# Patient Record
Sex: Female | Born: 1960 | Race: White | Hispanic: No | Marital: Married | Smoking: Never smoker
Health system: Southern US, Community
[De-identification: ages and names within clinical notes are randomized; demographics above are authoritative.]

## PROBLEM LIST (undated history)

## (undated) DIAGNOSIS — Z800 Family history of malignant neoplasm of digestive organs: Secondary | ICD-10-CM

## (undated) DIAGNOSIS — N390 Urinary tract infection, site not specified: Secondary | ICD-10-CM

## (undated) DIAGNOSIS — C439 Malignant melanoma of skin, unspecified: Secondary | ICD-10-CM

## (undated) HISTORY — DX: Malignant melanoma of skin, unspecified: C43.9

## (undated) HISTORY — PX: MELANOMA EXCISION: SHX5266

## (undated) HISTORY — DX: Family history of malignant neoplasm of digestive organs: Z80.0

## (undated) HISTORY — DX: Urinary tract infection, site not specified: N39.0

---

## 2004-10-14 ENCOUNTER — Ambulatory Visit: Payer: Self-pay | Admitting: Unknown Physician Specialty

## 2007-07-20 ENCOUNTER — Ambulatory Visit: Payer: Self-pay | Admitting: Internal Medicine

## 2009-12-06 ENCOUNTER — Ambulatory Visit: Payer: Self-pay | Admitting: Family Medicine

## 2009-12-06 LAB — CONVERTED CEMR LAB
Bilirubin Urine: NEGATIVE
Nitrite: POSITIVE
Specific Gravity, Urine: 1.005
Urobilinogen, UA: 0.2
pH: 5

## 2009-12-07 ENCOUNTER — Encounter: Payer: Self-pay | Admitting: Family Medicine

## 2010-02-25 NOTE — Assessment & Plan Note (Signed)
Summary: UTI/JBB   Vital Signs:  Patient Profile:   50 Years Old Female CC:      Possible UTI Height:     67.25 inches Weight:      132 pounds BMI:     20.59 O2 Sat:      98 % O2 treatment:    Room Air Temp:     98.0 degrees F oral Pulse rate:   70 / minute Pulse rhythm:   regular Resp:     18 per minute BP sitting:   122 / 74  (right arm)  Pt. in pain?   no  Vitals Entered By: Levonne Spiller EMT-P (December 06, 2009 1:21 PM)              Is Patient Diabetic? No      Current Allergies: No known allergies  Lab Results Urinalysis:      Color:     Red    Appear:     Clear    Leuk:     Trace    Nitr:     Pos    Urobil:     0.2    Prot:     Neg    pH:     5.0    Blood:     Trace    Sp. Gr:     1.005    Ket:     Neg    Bili:     Neg    Glu:     100  History of Present Illness History from: patient Chief Complaint: Possible UTI History of Present Illness: Pt reports 2 days of urinary frequency, burning urination, woke patient up in the middle of the night.  Started taking AZO and some relief of symptoms noted but persisted.  Pt reported that she had a uti 18 months ago and was treated and no recurrence until now.  Pt reports no fever, chills, abd pain or nausea or vomiting. The patient says that she does not have a history of frequent UTI or yeast infections.   REVIEW OF SYSTEMS Constitutional Symptoms      Denies fever, chills, night sweats, weight loss, weight gain, and fatigue.  Eyes       Denies change in vision, eye pain, eye discharge, glasses, contact lenses, and eye surgery. Ear/Nose/Throat/Mouth       Complains of ear pain.      Denies hearing loss/aids, change in hearing, ear discharge, dizziness, frequent runny nose, frequent nose bleeds, sinus problems, sore throat, hoarseness, and tooth pain or bleeding.      Comments: Bilateral Ear Pain Respiratory       Denies dry cough, productive cough, wheezing, shortness of breath, asthma, bronchitis, and  emphysema/COPD.  Cardiovascular       Denies murmurs, chest pain, and tires easily with exhertion.    Gastrointestinal       Denies stomach pain, nausea/vomiting, diarrhea, constipation, blood in bowel movements, and indigestion. Genitourniary       Complains of painful urination.      Denies kidney stones and loss of urinary control.      Comments: frequent urination Neurological       Denies paralysis, seizures, and fainting/blackouts. Musculoskeletal       Denies muscle pain, joint pain, joint stiffness, decreased range of motion, redness, swelling, muscle weakness, and gout.  Skin       Denies bruising, unusual mles/lumps or sores, and hair/skin or nail changes.  Psych  Denies mood changes, temper/anger issues, anxiety/stress, speech problems, depression, and sleep problems.  Past History:  Family History: Last updated: 12/06/2009 Denies  Social History: Last updated: 12/06/2009 Occupation: Self Employed Art gallery manager Never Smoked Alcohol use-no Drug use-no  Risk Factors: Smoking Status: never (12/06/2009)  Past Medical History: Reported a history of a UTI in 2009, otherwise healthy.   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 Smoked Alcohol use-no Drug use-no Smoking Status:  never Drug Use:  no Physical Exam General appearance: well developed, well nourished, no acute distress Head: normocephalic, atraumatic Eyes: conjunctivae and lids normal Pupils: equal, round, reactive to light Ears: normal, no lesions or deformities Nasal: pale, boggy, swollen nasal turbinates Oral/Pharynx: tongue normal, posterior pharynx without erythema or exudate Neck: neck supple,  trachea midline, no masses Chest/Lungs: no rales, wheezes, or rhonchi bilateral, breath sounds equal without effort Heart: regular rate and  rhythm, no murmur Abdomen: soft, non-tender without obvious  organomegaly GU: MILD suprapubic tenderness, NO CVA TTP Extremities: normal extremities Neurological: grossly intact and non-focal Skin: no obvious rashes or lesions MSE: oriented to time, place, and person Assessment New Problems: UPPER RESPIRATORY INFECTION, ACUTE (ICD-465.9) URINARY TRACT INFECTION (ICD-599.0)   Patient Education: Patient and/or caregiver instructed in the following: rest, fluids, Ibuprofen prn. The risks, benefits and possible side effects were clearly explained and discussed with the patient.  The patient verbalized clear understanding.  The patient was given instructions to return if symptoms don't improve, worsen or new changes develop.  If it is not during clinic hours and the patient cannot get back to this clinic then the patient was told to seek medical care at an available urgent care or emergency department.  The patient verbalized understanding.   Demonstrates willingness to comply.  Plan New Medications/Changes: CIPROFLOXACIN HCL 500 MG TABS (CIPROFLOXACIN HCL) take 1 by mouth two times a day until all gone  #14 x 0, 12/06/2009, Clanford Johnson MD  Follow Up: Follow up in 2-3 days if no improvement, Follow up on an as needed basis, Follow up with Primary Physician Follow Up: 10 days to have a repeat UA done  The patient and/or caregiver has been counseled thoroughly with regard to medications prescribed including dosage, schedule, interactions, rationale for use, and possible side effects and they verbalize understanding.  Diagnoses and expected course of recovery discussed and will return if not improved as expected or if the condition worsens. Patient and/or caregiver verbalized understanding.  Prescriptions: CIPROFLOXACIN HCL 500 MG TABS (CIPROFLOXACIN HCL) take 1 by mouth two times a day until all gone  #14 x 0   Entered and Authorized by:   Standley Dakins MD   Signed by:   Standley Dakins MD on 12/06/2009   Method used:   Electronically to         [facility] [address] (retail)       [address], [address]       [place], [place]  [address]       Ph: [phone]       Fax: [phone]   RxID:   [id number]   Patient Instructions: 1)  Get plenty of rest, drink lots of clear liquids, and use Tylenol or Ibuprofen for fever and comfort. Return in 7-10 days if you're not better:sooner if you're feeling worse. 2)  Take your antibiotic as prescribed until ALL of it is gone, but stop  if you develop a rash or swelling and contact our office as soon as possible. 3)  Look on [email or web address].pl to learn more about your condition  4)  Come get a Urine-dip in 10 days to get checked up and make sure it has resolved.  5)  The patient was informed that there is no on-call Khristian Seals or services available at this clinic during off-hours (when the clinic is closed).  If the patient developed a problem or concern that required immediate attention, the patient was advised to go the the nearest available urgent care or emergency department for medical care.  The patient verbalized understanding.

## 2010-04-20 ENCOUNTER — Encounter: Payer: Self-pay | Admitting: Family Medicine

## 2010-04-20 ENCOUNTER — Ambulatory Visit (INDEPENDENT_AMBULATORY_CARE_PROVIDER_SITE_OTHER): Payer: 59 | Admitting: Family Medicine

## 2010-04-20 DIAGNOSIS — L02219 Cutaneous abscess of trunk, unspecified: Secondary | ICD-10-CM | POA: Insufficient documentation

## 2010-04-20 DIAGNOSIS — L03319 Cellulitis of trunk, unspecified: Secondary | ICD-10-CM

## 2010-04-29 NOTE — Assessment & Plan Note (Signed)
Summary: Infected bug bite on stomach/evm   Vital Signs:  Patient profile:   50 year old female Height:      68.50 inches Weight:      136 pounds Temp:     98.4 degrees F oral Pulse rate:   80 / minute Pulse rhythm:   regular Resp:     16 per minute BP sitting:   94 / 60  (left arm) Cuff size:   regular  Vitals Entered By: Providence Crosby LPN (April 20, 2010 11:41 AM) Physical Exam General appearance: well developed, well nourished, no acute distress Head: normocephalic, atraumatic Abdomen: abcess  on the lower R abdomen openened and drainage present Extremities: normal extremities Neurological: grossly intact and non-focal Skin: no obvious rashes or lesions MSE: oriented to time, place, and person   History of Present Illness: Bug bite 04/14/2010 during the night. 3 days later became reddened and draining. States afebrile. History of Present Illness History of Present Illness: Patient with an abcesss over lower R abdomen. her husband has been getting it to drain over the last 3 days. It started w/ the bite on Monday.  Current Problems: ABDOMINAL ABSCESS (ICD-682.2)   Current Meds NUVARING 0.12-0.015 MG/24HR RING (ETONOGESTREL-ETHINYL ESTRADIOL)  SEPTRA DS 800-160 MG TABS (SULFAMETHOXAZOLE-TRIMETHOPRIM) 1 by mouth twice daily BACTROBAN 2 % OINT (MUPIROCIN) apply 3x aday for next 10 days HYDROCODONE-ACETAMINOPHEN 5-325 MG TABS (HYDROCODONE-ACETAMINOPHEN) 1 by mouth q6-8hrs as needed for pain     Problems Prior to Update: None  Medications Prior to Update: 1)  Nuvaring 0.12-0.015 Mg/24hr Ring (Etonogestrel-Ethinyl Estradiol) 2)  Azo Tabs 95 Mg Tabs (Phenazopyridine Hcl)  Allergies (verified): No Known Drug Allergies  Past History:  Past Medical History: Last updated: 12/06/2009 Reported a history of a UTI in 2009, otherwise healthy.   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  Plan New Medications/Changes: HYDROCODONE-ACETAMINOPHEN 5-325 MG TABS (HYDROCODONE-ACETAMINOPHEN) 1 by mouth q6-8hrs as needed for pain  #20 x 0, 04/20/2010, Hassan Rowan MD BACTROBAN 2 % OINT (MUPIROCIN) apply 3x aday for next 10 days  #1 tube x 0, 04/20/2010, Hassan Rowan MD SEPTRA DS 800-160 MG TABS (SULFAMETHOXAZOLE-TRIMETHOPRIM) 1 by mouth twice daily  #20 x 0, 04/20/2010, Hassan Rowan MD BACTROBAN 2 % OINT (MUPIROCIN) apply 3x aday for next 10 days  #1 tube x 0, 04/20/2010, Hassan Rowan MD SEPTRA DS 800-160 MG TABS (SULFAMETHOXAZOLE-TRIMETHOPRIM) 1 by mouth twice daily  #20 x 0, 04/20/2010, Hassan Rowan MD  New Orders: Est. Patient Level III [[address]] I&D Abscess, Simple / Single [[address]] Follow Up: Follow up on an as needed basis, Follow up with Primary Physician Work/School Excuse: Return to work/school tomorrow  The patient and/or caregiver has been counseled thoroughly with regard to medications prescribed including dosage, schedule, interactions, rationale for use, and possible side effects and they verbalize understanding.  Diagnoses and expected course of recovery discussed and will return if not improved as expected or if the condition worsens. Patient and/or caregiver verbalized understanding.    Family History: Reviewed history from 12/06/2009 and no changes required. Denies  Social History: Reviewed history from 12/06/2009 and no changes required. 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 2%lidocaine w/epi used to numb area and to irrigate area. Abcess was already opened. Using hemostat the wound was opened further and culture obtained before irrigating . patient tolerated precedure welll and wound was covered.  Complete Medication List: 1)  Nuvaring 0.12-0.015 Mg/24hr Ring (Etonogestrel-ethinyl estradiol) 2)  Septra Ds 800-160 Mg Tabs (Sulfamethoxazole-trimethoprim) .Marland Kitchen.. 1 by mouth twice daily 3)  Bactroban 2 % Oint (Mupirocin) .... Apply 3x aday for next 10 days 4)  Hydrocodone-acetaminophen 5-325 Mg Tabs (Hydrocodone-acetaminophen) .Marland Kitchen.. 1 by mouth q6-8hrs as needed for pain  Other Orders: I&D Abscess, Simple / Single ([address])  Patient Instructions: 1)  Abcess was not packed today due to the wound being wide enough and should drain on its own if neeeded. Dual antibiotic pill and ointment will be prescribed. Use pain medication as needed. 2)  Please schedule a follow-up appointment in 2 weeks. 3)  Please schedule an appointment with your primary doctor in :2 weeks 4)  Take your antibiotic as prescribed until ALL of it is gone, but stop if you develop a rash or swelling and contact our office as soon as possible. Prescriptions: HYDROCODONE-ACETAMINOPHEN 5-325 MG TABS (HYDROCODONE-ACETAMINOPHEN) 1 by mouth q6-8hrs as needed for pain  #20 x 0   Entered and Authorized by:   Hassan Rowan MD   Signed by:   Hassan Rowan MD on 04/20/2010   Method used:   Printed then faxed to ...       [facility] [address] (retail)       [address]       [place], [place]  [address]       Ph: [phone]       Fax: [phone]   RxID:   [id number] BACTROBAN 2 % OINT (MUPIROCIN) apply 3x aday for next 10 days  #1 tube x 0   Entered and Authorized by:   Hassan Rowan MD   Signed by:   Hassan Rowan MD on 04/20/2010   Method used:   Printed then faxed to ...       [facility] (retail)       [address], [address]       [place], [place]  [address]       Ph: [phone]       Fax: [phone]   RxID:   [id number] SEPTRA DS 800-160 MG TABS (SULFAMETHOXAZOLE-TRIMETHOPRIM) 1 by mouth twice daily  #20 x 0   Entered and Authorized by:   Hassan Rowan MD   Signed by:   Hassan Rowan MD on 04/20/2010   Method used:   Printed then faxed to ...       [facility] [address] (retail)       [address], [address]       [place], [place]  [address]       Ph: [phone]       Fax: [phone]   RxID:   [id number] BACTROBAN 2 % OINT (MUPIROCIN) apply 3x aday for next 10 days  #1 tube x 0   Entered and Authorized by:   Hassan Rowan MD   Signed by:   Hassan Rowan MD on 04/20/2010  Method used:   Printed then faxed to ...       [facility] (retail)       [address]       [place], [place]  [address]       Ph: [phone]       Fax: [phone]   RxID:   [id number] SEPTRA DS 800-160 MG TABS (SULFAMETHOXAZOLE-TRIMETHOPRIM) 1 by mouth twice daily  #20 x 0   Entered and Authorized by:   Hassan Rowan MD   Signed by:   Hassan Rowan MD on 04/20/2010   Method used:   Printed then faxed to ...       [facility] [address] (retail)       [address], [address]       [place], [place]  [address]       Ph: [phone]       Fax: [phone]   RxID:   [id number]    Orders Added: 1)  Est. Patient Level III [[address]] 2)  I&D Abscess, Simple / Single [[address]]

## 2012-01-27 DIAGNOSIS — C439 Malignant melanoma of skin, unspecified: Secondary | ICD-10-CM

## 2012-01-27 HISTORY — DX: Malignant melanoma of skin, unspecified: C43.9

## 2015-05-28 ENCOUNTER — Ambulatory Visit: Payer: Self-pay | Admitting: Urology

## 2015-05-28 ENCOUNTER — Encounter: Payer: Self-pay | Admitting: Urology

## 2015-09-27 HISTORY — PX: COLONOSCOPY: SHX174

## 2016-07-20 ENCOUNTER — Ambulatory Visit (INDEPENDENT_AMBULATORY_CARE_PROVIDER_SITE_OTHER): Payer: 59 | Admitting: Obstetrics and Gynecology

## 2016-07-20 ENCOUNTER — Encounter: Payer: Self-pay | Admitting: Obstetrics and Gynecology

## 2016-07-20 VITALS — BP 98/58 | HR 66 | Ht 68.0 in | Wt 132.0 lb

## 2016-07-20 DIAGNOSIS — Z1239 Encounter for other screening for malignant neoplasm of breast: Secondary | ICD-10-CM

## 2016-07-20 DIAGNOSIS — Z1231 Encounter for screening mammogram for malignant neoplasm of breast: Secondary | ICD-10-CM | POA: Diagnosis not present

## 2016-07-20 DIAGNOSIS — Z1151 Encounter for screening for human papillomavirus (HPV): Secondary | ICD-10-CM | POA: Diagnosis not present

## 2016-07-20 DIAGNOSIS — Z01419 Encounter for gynecological examination (general) (routine) without abnormal findings: Secondary | ICD-10-CM

## 2016-07-20 DIAGNOSIS — Z124 Encounter for screening for malignant neoplasm of cervix: Secondary | ICD-10-CM | POA: Diagnosis not present

## 2016-07-20 NOTE — Progress Notes (Signed)
Chief Complaint  Patient presents with  . Gynecologic Exam    HPI:      [name] is a 56 y.o. No obstetric history on file. who LMP was No LMP recorded (exact date). Patient is postmenopausal., presents today for her annual examination.  Her menses are absent and she is postmenopausal. She does not have intermenstrual bleeding.  She does not have vasomotor sx.  Sex activity: single partner, contraception - post menopausal status. She does not have vaginal dryness.  Last Pap: March 25, 2015  Results were: no abnormalities . Pt likes yearly paps. Neg HPV DNA 2/15.   Last mammogram: May 22, 2015  Results were: normal--routine follow-up in 12 months There is a FH of breast cancer in her [place], genetic testing not idicated. There is no FH of ovarian cancer. The patient does do self-breast exams.  Colonoscopy: colonoscopy 2 years ago without abnormalities. Repeat due  In 10 yrs.   Tobacco use: The patient denies current or previous tobacco use. Alcohol use: none Exercise: moderately active  She does get adequate calcium and Vitamin D in her diet. She had normal fasting labs 2017.  Past Medical History:  Diagnosis Date  . Melanoma ([place]) 2014  . Urinary tract infection     Past Surgical History:  Procedure Laterality Date  . CESAREAN SECTION     x2  . COLONOSCOPY  09/2015   normal   . MELANOMA EXCISION      Family History  Problem Relation Age of Onset  . Pancreatic cancer Maternal Grandmother 80  . Colon cancer Maternal Grandfather 48  . Breast cancer Other 68    Social History   Social History  . Marital status: Married    Spouse name: N/A  . Number of children: N/A  . Years of education: N/A   Occupational History  . Not on file.   Social History Main Topics  . Smoking status: Never Smoker  . Smokeless tobacco: Never Used  . Alcohol use No  . Drug use: No  . Sexual activity: Yes    Birth control/ protection: Post-menopausal   Other  Topics Concern  . Not on file   Social History Narrative  . No narrative on file    No current outpatient prescriptions on file.   ROS:  Review of Systems  Constitutional: Negative for fatigue, fever and unexpected weight change.  Respiratory: Negative for cough, shortness of breath and wheezing.   Cardiovascular: Negative for chest pain, palpitations and leg swelling.  Gastrointestinal: Negative for blood in stool, constipation, diarrhea, nausea and vomiting.  Endocrine: Negative for cold intolerance, heat intolerance and polyuria.  Genitourinary: Negative for dyspareunia, dysuria, flank pain, frequency, genital sores, hematuria, menstrual problem, pelvic pain, urgency, vaginal bleeding, vaginal discharge and vaginal pain.  Musculoskeletal: Negative for back pain, joint swelling and myalgias.  Skin: Negative for rash.  Neurological: Negative for dizziness, syncope, light-headedness, numbness and headaches.  Hematological: Negative for adenopathy.  Psychiatric/Behavioral: Negative for agitation, confusion, sleep disturbance and suicidal ideas. The patient is not nervous/anxious.      Objective: BP (!) 98/58   Pulse 66   Ht 5\' 8"  (1.727 m)   Wt 132 lb (59.9 kg)   LMP  (Exact Date)   BMI 20.07 kg/m    Physical Exam  Constitutional: She is oriented to person, place, and time. She appears well-developed and well-nourished.  Genitourinary: Vagina normal and uterus normal. There is no rash or tenderness on the right labia.  There is no rash or tenderness on the left labia. No erythema or tenderness in the vagina. No vaginal discharge found. Right adnexum does not display mass and does not display tenderness. Left adnexum does not display mass and does not display tenderness. Cervix does not exhibit motion tenderness or polyp. Uterus is not enlarged or tender.  Neck: Normal range of motion. No thyromegaly present.  Cardiovascular: Normal rate, regular rhythm and normal heart sounds.    No murmur heard. Pulmonary/Chest: Effort normal and breath sounds normal. Right breast exhibits no mass, no nipple discharge, no skin change and no tenderness. Left breast exhibits no mass, no nipple discharge, no skin change and no tenderness.  Abdominal: Soft. There is no tenderness. There is no guarding.  Musculoskeletal: Normal range of motion.  Neurological: She is alert and oriented to person, place, and time. No cranial nerve deficit.  Psychiatric: She has a normal mood and affect. Her behavior is normal.  Vitals reviewed.    Assessment/Plan:  Encounter for annual routine gynecological examination  Cervical cancer screening - Plan: IGP, Aptima HPV  Screening for HPV (human papillomavirus) - Plan: IGP, Aptima HPV  Screening for breast cancer - Pt to sched mammo at [facility]. - Plan: MM DIGITAL SCREENING BILATERAL          GYN counsel mammography screening, adequate intake of calcium and vitamin D, diet and exercise     F/U  Return in about 1 year (around 07/20/2017).  Jasani Dolney B. Sherril Shipman, PA-C 07/20/2016 2:31 PM

## 2016-07-22 LAB — IGP, APTIMA HPV
HPV Aptima: NEGATIVE
PAP Smear Comment: 0

## 2016-09-17 DIAGNOSIS — Z85820 Personal history of malignant melanoma of skin: Secondary | ICD-10-CM | POA: Diagnosis not present

## 2016-12-01 ENCOUNTER — Encounter: Payer: Self-pay | Admitting: Obstetrics and Gynecology

## 2016-12-01 ENCOUNTER — Ambulatory Visit: Payer: 59 | Admitting: Obstetrics and Gynecology

## 2016-12-01 VITALS — BP 122/78 | Ht 67.0 in | Wt 136.0 lb

## 2016-12-01 DIAGNOSIS — N3000 Acute cystitis without hematuria: Secondary | ICD-10-CM | POA: Diagnosis not present

## 2016-12-01 LAB — POCT URINALYSIS DIPSTICK
BILIRUBIN UA: NEGATIVE
Glucose, UA: NEGATIVE
KETONES UA: NEGATIVE
Leukocytes, UA: NEGATIVE
NITRITE UA: NEGATIVE
PH UA: 6 (ref 5.0–8.0)
Protein, UA: NEGATIVE
RBC UA: NEGATIVE
Spec Grav, UA: 1.01 (ref 1.010–1.025)

## 2016-12-01 MED ORDER — AMOXICILLIN 500 MG PO CAPS: 500.0000 mg | ORAL_CAPSULE | Freq: Two times a day (BID) | ORAL | 0 refills | Status: AC

## 2016-12-01 NOTE — Patient Instructions (Signed)
I value your feedback and appreciate you entrusting us with your care. If you get a [facility] patient survey, I would appreciate you taking the time to let us know what your experience was like. Thank you! 

## 2016-12-01 NOTE — Progress Notes (Signed)
Chief Complaint  Patient presents with  . Dysuria    HPI:      [name] is a 56 y.o. [phone] who LMP was No LMP recorded. Patient is postmenopausal., presents today for UTI sx of urinary urgency/frequency x 4 days. Sx triggered by sex. No dysuria, hematuria, LBP, belly pain, fevers. No vag sx. Hx of UTIs in the past, last one from [facility]. Pt took 1 lefotver amox 2 days ago and is taking AZO with some relief.   Past Medical History:  Diagnosis Date  . Melanoma ([place]) 2014  . Urinary tract infection     Past Surgical History:  Procedure Laterality Date  . CESAREAN SECTION     x2  . COLONOSCOPY  09/2015   normal   . MELANOMA EXCISION      Family History  Problem Relation Age of Onset  . Pancreatic cancer Maternal Grandmother 80  . Colon cancer Maternal Grandfather 34  . Breast cancer Other 76    Social History   Socioeconomic History  . Marital status: Married    Spouse name: Not on file  . Number of children: Not on file  . Years of education: Not on file  . Highest education level: Not on file  Social Needs  . Financial resource strain: Not on file  . Food insecurity - worry: Not on file  . Food insecurity - inability: Not on file  . Transportation needs - medical: Not on file  . Transportation needs - non-medical: Not on file  Occupational History  . Not on file  Tobacco Use  . Smoking status: Never Smoker  . Smokeless tobacco: Never Used  Substance and Sexual Activity  . Alcohol use: No  . Drug use: No  . Sexual activity: Yes    Birth control/protection: Post-menopausal  Other Topics Concern  . Not on file  Social History Narrative  . Not on file     Current Outpatient Medications:  .  amoxicillin (AMOXIL) 500 MG capsule, Take 1 capsule (500 mg total) 2 (two) times daily for 7 days by mouth., Disp: 14 capsule, Rfl: 0   ROS:  Review of Systems  Constitutional: Negative for fever.  Gastrointestinal: Negative for blood in stool,  constipation, diarrhea, nausea and vomiting.  Genitourinary: Positive for frequency and urgency. Negative for dyspareunia, dysuria, flank pain, hematuria, vaginal bleeding, vaginal discharge and vaginal pain.  Musculoskeletal: Negative for back pain.  Skin: Negative for rash.     OBJECTIVE:   Vitals:  BP 122/78   Ht 5\' 7"  (1.702 m)   Wt 136 lb (61.7 kg)   BMI 21.30 kg/m   Physical Exam  Constitutional: She is oriented to person, place, and time and well-developed, well-nourished, and in no distress.  Abdominal: There is no CVA tenderness.  Neurological: She is alert and oriented to person, place, and time.  Psychiatric: Affect and judgment normal.  Vitals reviewed.   Results: Results for orders placed or performed in visit on 12/01/16 (from the past 24 hour(s))  POCT Urinalysis Dipstick     Status: Normal   Collection Time: 12/01/16 11:52 AM  Result Value Ref Range   Color, UA orange    Clarity, UA clear    Glucose, UA neg    Bilirubin, UA neg    Ketones, UA neg    Spec Grav, UA 1.010 1.010 - 1.025   Blood, UA neg    pH, UA 6.0 5.0 - 8.0   Protein, UA neg  Urobilinogen, UA  0.2 or 1.0 E.U./dL   Nitrite, UA neg    Leukocytes, UA Negative Negative     Assessment/Plan: Acute cystitis without hematuria - Rx amox (since GBS last time, and pt's urine better after taking amox dose). Check C&S. F/u prn.  - Plan: Urine Culture, POCT Urinalysis Dipstick, amoxicillin (AMOXIL) 500 MG capsule    Meds ordered this encounter  Medications  . amoxicillin (AMOXIL) 500 MG capsule    Sig: Take 1 capsule (500 mg total) 2 (two) times daily for 7 days by mouth.    Dispense:  14 capsule    Refill:  0      Return if symptoms worsen or fail to improve.  Taylour Lietzke B. Ghalia Reicks, PA-C 12/01/2016 11:55 AM

## 2016-12-03 LAB — URINE CULTURE: ORGANISM ID, BACTERIA: NO GROWTH

## 2016-12-20 DIAGNOSIS — H16142 Punctate keratitis, left eye: Secondary | ICD-10-CM | POA: Diagnosis not present

## 2016-12-20 DIAGNOSIS — H04123 Dry eye syndrome of bilateral lacrimal glands: Secondary | ICD-10-CM | POA: Diagnosis not present

## 2016-12-20 DIAGNOSIS — H17822 Peripheral opacity of cornea, left eye: Secondary | ICD-10-CM | POA: Diagnosis not present

## 2017-02-23 ENCOUNTER — Encounter: Payer: Self-pay | Admitting: *Deleted

## 2017-02-23 ENCOUNTER — Emergency Department: Payer: 59

## 2017-02-23 ENCOUNTER — Other Ambulatory Visit: Payer: Self-pay

## 2017-02-23 ENCOUNTER — Emergency Department
Admission: EM | Admit: 2017-02-23 | Discharge: 2017-02-23 | Disposition: A | Discharge status: Home / Self Care | Payer: 59 | Attending: Emergency Medicine | Admitting: Emergency Medicine

## 2017-02-23 DIAGNOSIS — M25511 Pain in right shoulder: Secondary | ICD-10-CM | POA: Diagnosis not present

## 2017-02-23 DIAGNOSIS — Y939 Activity, unspecified: Secondary | ICD-10-CM | POA: Insufficient documentation

## 2017-02-23 DIAGNOSIS — W108XXA Fall (on) (from) other stairs and steps, initial encounter: Secondary | ICD-10-CM | POA: Insufficient documentation

## 2017-02-23 DIAGNOSIS — Y999 Unspecified external cause status: Secondary | ICD-10-CM | POA: Diagnosis not present

## 2017-02-23 DIAGNOSIS — S42201A Unspecified fracture of upper end of right humerus, initial encounter for closed fracture: Secondary | ICD-10-CM | POA: Diagnosis not present

## 2017-02-23 DIAGNOSIS — Y929 Unspecified place or not applicable: Secondary | ICD-10-CM | POA: Insufficient documentation

## 2017-02-23 DIAGNOSIS — M25551 Pain in right hip: Secondary | ICD-10-CM | POA: Diagnosis not present

## 2017-02-23 DIAGNOSIS — S79911A Unspecified injury of right hip, initial encounter: Secondary | ICD-10-CM | POA: Diagnosis not present

## 2017-02-23 DIAGNOSIS — W11XXXA Fall on and from ladder, initial encounter: Secondary | ICD-10-CM | POA: Diagnosis not present

## 2017-02-23 DIAGNOSIS — S4991XA Unspecified injury of right shoulder and upper arm, initial encounter: Secondary | ICD-10-CM | POA: Diagnosis present

## 2017-02-23 MED ORDER — OXYCODONE-ACETAMINOPHEN 5-325 MG PO TABS: 1.0000 | ORAL_TABLET | Freq: Four times a day (QID) | ORAL | 0 refills | Status: DC | PRN

## 2017-02-23 MED ORDER — MORPHINE SULFATE (PF) 4 MG/ML IV SOLN
4.0000 mg | Freq: Once | INTRAVENOUS | Status: AC
  Administered 2017-02-23: 4 mg via INTRAMUSCULAR
  Filled 2017-02-23: qty 1

## 2017-02-23 NOTE — ED Provider Notes (Signed)
[facility] Emergency Department Provider Note ____________________________________________   First MD Initiated Contact with Patient 02/23/17 2129     (approximate)  I have reviewed the triage vital signs and the nursing notes.   HISTORY  Chief Complaint Shoulder Pain and Fall    HPI Maureen Anderson is a 57 y.o. female with past medical history as noted below who presents with right shoulder injury, acute onset when the patient was standing on approximately 3-4 foot step stool and fell off onto the right shoulder.  She denies associated head injury or LOC.  The patient states that she also hit her right hip.   Past Medical History:  Diagnosis Date  . Melanoma ([place]) 2014  . Urinary tract infection     Patient Active Problem List   Diagnosis Date Noted  . ABDOMINAL ABSCESS 04/20/2010    Past Surgical History:  Procedure Laterality Date  . CESAREAN SECTION     x2  . COLONOSCOPY  09/2015   normal   . MELANOMA EXCISION      Prior to Admission medications   Medication Sig Start Date End Date Taking? Authorizing Provider  oxyCODONE-acetaminophen (PERCOCET) 5-325 MG tablet Take 1-2 tablets by mouth every 6 (six) hours as needed for severe pain. 02/23/17 03/25/17  Arta Silence, MD    Allergies Patient has no known allergies.  Family History  Problem Relation Age of Onset  . Pancreatic cancer Maternal Grandmother 80  . Colon cancer Maternal Grandfather 34  . Breast cancer Other 41    Social History Social History   Tobacco Use  . Smoking status: Never Smoker  . Smokeless tobacco: Never Used  Substance Use Topics  . Alcohol use: No  . Drug use: No    Review of Systems  Constitutional: No fever. Eyes: No redness. ENT: No neck pain. Cardiovascular: Denies chest pain. Respiratory: Denies shortness of breath. Gastrointestinal: Negative for abdominal pain.  Genitourinary: Negative for flank pain.  Musculoskeletal: Negative for  back pain.  Positive for right shoulder injury. Skin: Negative for abrasions or lacerations. Neurological: Negative for headaches, focal weakness or numbness.   ____________________________________________   PHYSICAL EXAM:  VITAL SIGNS: ED Triage Vitals  Enc Vitals Group     BP 02/23/17 2105 126/73     Pulse Rate 02/23/17 2105 68     Resp --      Temp 02/23/17 2105 98.2 F (36.8 C)     Temp Source 02/23/17 2105 Oral     SpO2 02/23/17 2105 100 %     Weight 02/23/17 2108 140 lb (63.5 kg)     Height 02/23/17 2108 5\' 8"  (1.727 m)     Head Circumference --      Peak Flow --      Pain Score 02/23/17 2107 10     Pain Loc --      Pain Edu? --      Excl. in [place]? --     Constitutional: Alert and oriented.  Slightly uncomfortable appearing but in no acute distress. Eyes: Conjunctivae are normal.  Head: Atraumatic. Nose: No congestion/rhinnorhea. Mouth/Throat: Mucous membranes are moist.   Neck: Normal range of motion.  Cardiovascular: Good peripheral circulation. Respiratory: Normal respiratory effort.   Gastrointestinal: No distention.  Musculoskeletal: Pain on range of motion of right shoulder.  Tenderness to the proximal humerus.  2+ radial pulse.  Full range of motion right hip with no significant pain. Neurologic:  Normal speech and language.  Motor and sensory intact  in all extremities, and in median/radial/ulnar distributions of right arm.   Skin:  Skin is warm and dry. No rash noted. Psychiatric: Mood and affect are normal. Speech and behavior are normal.  ____________________________________________   LABS (all labs ordered are listed, but only abnormal results are displayed)  Labs Reviewed - No data to display ____________________________________________  EKG   ____________________________________________  RADIOLOGY  XR right shoulder: Proximal humerus fracture XR right hip and pelvis: No acute  fractures  ____________________________________________   PROCEDURES  Procedure(s) performed: No  Procedures  Critical Care performed: No ____________________________________________   INITIAL IMPRESSION / ASSESSMENT AND PLAN / ED COURSE  Pertinent labs & imaging results that were available during my care of the patient were reviewed by me and considered in my medical decision making (see chart for details).  57 year old female presents with right shoulder injury pain after mechanical fall from approximately 3-4 foot high step stool.  No head injury or LOC.  She has significant pain on range of motion of right shoulder, concerning for proximal humerus fracture versus possible shoulder dislocation.  The right upper extremity is neuro/vascular intact.  Low suspicion for injury to right hip as patient has full range of motion and no significant tenderness.  Plan x-rays of the relevant areas, analgesia and reassess.  ----------------------------------------- 10:06 PM on 02/23/2017 -----------------------------------------  X-ray reveals right proximal humerus fracture with no dislocation.  Right hip and pelvis films are negative.  I consulted Dr. Marry Guan from orthopedics, who  recommends shoulder immobilizer and follow-up in 4-7 days.  Patient stable for discharge home.    ____________________________________________   FINAL CLINICAL IMPRESSION(S) / ED DIAGNOSES  Final diagnoses:  Closed fracture of proximal end of right humerus, unspecified fracture morphology, initial encounter      NEW MEDICATIONS STARTED DURING THIS VISIT:  New Prescriptions   OXYCODONE-ACETAMINOPHEN (PERCOCET) 5-325 MG TABLET    Take 1-2 tablets by mouth every 6 (six) hours as needed for severe pain.     Note:  This document was prepared using Dragon voice recognition software and may include unintentional dictation errors.     Arta Silence, MD 02/23/17 2243

## 2017-02-23 NOTE — ED Triage Notes (Signed)
Pt arrived by EMS was standing on a step stool in socks and slipped and fell and injured R shoulder and also has pain to R hip

## 2017-02-23 NOTE — Discharge Instructions (Signed)
Keep the shoulder immobilizer on at all times except when showering.  Be careful not to move or reinjured the shoulder.  You can take the prescribed pain medication as needed for pain.  Follow-up with Dr. Marry Guan or an orthopedist of your choice within 4-7 days.  Return to the emergency department for new, worsening, or persistent severe pain not controlled by the pain medication, weakness or numbness, or any other new or worsening symptoms that concern you.

## 2017-03-02 ENCOUNTER — Ambulatory Visit
Admission: RE | Admit: 2017-03-02 | Discharge: 2017-03-02 | Disposition: A | Discharge status: Home / Self Care | Payer: 59 | Source: Ambulatory Visit | Attending: Surgery | Admitting: Surgery

## 2017-03-02 ENCOUNTER — Other Ambulatory Visit: Payer: Self-pay | Admitting: Surgery

## 2017-03-02 DIAGNOSIS — M25511 Pain in right shoulder: Secondary | ICD-10-CM

## 2017-03-02 DIAGNOSIS — S42211A Unspecified displaced fracture of surgical neck of right humerus, initial encounter for closed fracture: Secondary | ICD-10-CM | POA: Insufficient documentation

## 2017-03-02 DIAGNOSIS — S42291A Other displaced fracture of upper end of right humerus, initial encounter for closed fracture: Secondary | ICD-10-CM | POA: Diagnosis not present

## 2017-03-02 DIAGNOSIS — X58XXXA Exposure to other specified factors, initial encounter: Secondary | ICD-10-CM | POA: Diagnosis not present

## 2017-03-02 DIAGNOSIS — S42201A Unspecified fracture of upper end of right humerus, initial encounter for closed fracture: Secondary | ICD-10-CM | POA: Diagnosis not present

## 2017-03-03 ENCOUNTER — Ambulatory Visit: Payer: 59

## 2017-03-04 DIAGNOSIS — S42291A Other displaced fracture of upper end of right humerus, initial encounter for closed fracture: Secondary | ICD-10-CM | POA: Diagnosis not present

## 2017-03-07 MED ORDER — CEFAZOLIN SODIUM-DEXTROSE 2-4 GM/100ML-% IV SOLN
2.0000 g | Freq: Once | INTRAVENOUS | Status: AC
  Administered 2017-03-08: 1 g via INTRAVENOUS
  Administered 2017-03-08: 2 g via INTRAVENOUS

## 2017-03-08 ENCOUNTER — Inpatient Hospital Stay: Payer: 59

## 2017-03-08 ENCOUNTER — Ambulatory Visit: Payer: 59

## 2017-03-08 ENCOUNTER — Ambulatory Visit: Payer: 59 | Admitting: Anesthesiology

## 2017-03-08 ENCOUNTER — Observation Stay
Admission: AD | Admit: 2017-03-08 | Discharge: 2017-03-09 | Disposition: A | Discharge status: Home / Self Care | Payer: 59 | Source: Ambulatory Visit | Attending: Orthopedic Surgery | Admitting: Orthopedic Surgery

## 2017-03-08 ENCOUNTER — Encounter: Payer: Self-pay | Admitting: *Deleted

## 2017-03-08 ENCOUNTER — Encounter
Admission: AD | Disposition: A | Discharge status: Home / Self Care | Payer: Self-pay | Source: Ambulatory Visit | Attending: Orthopedic Surgery

## 2017-03-08 ENCOUNTER — Other Ambulatory Visit: Payer: Self-pay

## 2017-03-08 DIAGNOSIS — W08XXXA Fall from other furniture, initial encounter: Secondary | ICD-10-CM | POA: Insufficient documentation

## 2017-03-08 DIAGNOSIS — S42201A Unspecified fracture of upper end of right humerus, initial encounter for closed fracture: Principal | ICD-10-CM | POA: Insufficient documentation

## 2017-03-08 DIAGNOSIS — M7521 Bicipital tendinitis, right shoulder: Secondary | ICD-10-CM | POA: Insufficient documentation

## 2017-03-08 DIAGNOSIS — Z96619 Presence of unspecified artificial shoulder joint: Secondary | ICD-10-CM

## 2017-03-08 DIAGNOSIS — Z85820 Personal history of malignant melanoma of skin: Secondary | ICD-10-CM | POA: Insufficient documentation

## 2017-03-08 DIAGNOSIS — G8918 Other acute postprocedural pain: Secondary | ICD-10-CM | POA: Diagnosis not present

## 2017-03-08 DIAGNOSIS — M25511 Pain in right shoulder: Secondary | ICD-10-CM | POA: Diagnosis not present

## 2017-03-08 DIAGNOSIS — T148XXA Other injury of unspecified body region, initial encounter: Secondary | ICD-10-CM

## 2017-03-08 DIAGNOSIS — S42291A Other displaced fracture of upper end of right humerus, initial encounter for closed fracture: Secondary | ICD-10-CM | POA: Diagnosis not present

## 2017-03-08 HISTORY — PX: ORIF HUMERUS FRACTURE: SHX2126

## 2017-03-08 SURGERY — OPEN REDUCTION INTERNAL FIXATION (ORIF) PROXIMAL HUMERUS FRACTURE
Anesthesia: General | Laterality: Right | Wound class: Clean

## 2017-03-08 MED ORDER — EPHEDRINE SULFATE 50 MG/ML IJ SOLN
INTRAMUSCULAR | Status: DC | PRN
  Administered 2017-03-08: 5 mg via INTRAVENOUS

## 2017-03-08 MED ORDER — MIDAZOLAM HCL 2 MG/2ML IJ SOLN
INTRAMUSCULAR | Status: DC | PRN
  Administered 2017-03-08: 2 mg via INTRAVENOUS

## 2017-03-08 MED ORDER — ONDANSETRON HCL 4 MG PO TABS: 4.0000 mg | ORAL_TABLET | Freq: Four times a day (QID) | ORAL | Status: DC | PRN

## 2017-03-08 MED ORDER — BISACODYL 10 MG RE SUPP: 10.0000 mg | Freq: Every day | RECTAL | Status: DC | PRN

## 2017-03-08 MED ORDER — FAMOTIDINE 20 MG PO TABS
20.0000 mg | ORAL_TABLET | Freq: Once | ORAL | Status: AC
  Administered 2017-03-08: 20 mg via ORAL

## 2017-03-08 MED ORDER — BUPIVACAINE LIPOSOME 1.3 % IJ SUSP
INTRAMUSCULAR | Status: AC
  Filled 2017-03-08: qty 20

## 2017-03-08 MED ORDER — ADULT MULTIVITAMIN W/MINERALS CH: 1.0000 | ORAL_TABLET | ORAL | Status: DC

## 2017-03-08 MED ORDER — ACETAMINOPHEN 500 MG PO TABS
1000.0000 mg | ORAL_TABLET | Freq: Three times a day (TID) | ORAL | Status: AC
  Administered 2017-03-08 – 2017-03-09 (×2): 1000 mg via ORAL
  Filled 2017-03-08 (×2): qty 2

## 2017-03-08 MED ORDER — SODIUM CHLORIDE 0.9 % IV SOLN
INTRAVENOUS | Status: DC
  Administered 2017-03-08: 18:00:00 via INTRAVENOUS

## 2017-03-08 MED ORDER — MIDAZOLAM HCL 2 MG/2ML IJ SOLN
1.0000 mg | Freq: Once | INTRAMUSCULAR | Status: AC
  Administered 2017-03-08: 1 mg via INTRAVENOUS

## 2017-03-08 MED ORDER — LOTEPREDNOL ETABONATE 0.5 % OP SUSP
2.0000 [drp] | Freq: Every day | OPHTHALMIC | Status: DC
  Administered 2017-03-09: 2 [drp] via OPHTHALMIC
  Filled 2017-03-08: qty 5

## 2017-03-08 MED ORDER — PROPOFOL 10 MG/ML IV BOLUS
INTRAVENOUS | Status: AC
  Filled 2017-03-08: qty 20

## 2017-03-08 MED ORDER — DEXAMETHASONE SODIUM PHOSPHATE 10 MG/ML IJ SOLN
INTRAMUSCULAR | Status: DC | PRN
  Administered 2017-03-08: 5 mg via INTRAVENOUS

## 2017-03-08 MED ORDER — LIDOCAINE HCL (PF) 2 % IJ SOLN
INTRAMUSCULAR | Status: AC
  Filled 2017-03-08: qty 10

## 2017-03-08 MED ORDER — ONDANSETRON HCL 4 MG/2ML IJ SOLN
INTRAMUSCULAR | Status: DC | PRN
  Administered 2017-03-08 (×2): 4 mg via INTRAVENOUS

## 2017-03-08 MED ORDER — DOCUSATE SODIUM 100 MG PO CAPS
100.0000 mg | ORAL_CAPSULE | Freq: Two times a day (BID) | ORAL | Status: DC
  Administered 2017-03-08 – 2017-03-09 (×2): 100 mg via ORAL
  Filled 2017-03-08 (×2): qty 1

## 2017-03-08 MED ORDER — ONDANSETRON HCL 4 MG/2ML IJ SOLN
INTRAMUSCULAR | Status: AC
  Filled 2017-03-08: qty 2

## 2017-03-08 MED ORDER — FENTANYL CITRATE (PF) 100 MCG/2ML IJ SOLN: 25.0000 ug | INTRAMUSCULAR | Status: DC | PRN

## 2017-03-08 MED ORDER — ALUM & MAG HYDROXIDE-SIMETH 200-200-20 MG/5ML PO SUSP
30.0000 mL | ORAL | Status: DC | PRN
Start: 2017-03-08 — End: 2017-03-09

## 2017-03-08 MED ORDER — FAMOTIDINE 20 MG PO TABS
ORAL_TABLET | ORAL | Status: AC
  Filled 2017-03-08: qty 1

## 2017-03-08 MED ORDER — FENTANYL CITRATE (PF) 100 MCG/2ML IJ SOLN
INTRAMUSCULAR | Status: AC
  Filled 2017-03-08: qty 2

## 2017-03-08 MED ORDER — SODIUM CHLORIDE 0.9 % IR SOLN
Status: DC | PRN
  Administered 2017-03-08: 1000 mL

## 2017-03-08 MED ORDER — BUPIVACAINE HCL (PF) 0.5 % IJ SOLN
INTRAMUSCULAR | Status: AC
  Filled 2017-03-08: qty 10

## 2017-03-08 MED ORDER — LACTATED RINGERS IV SOLN
INTRAVENOUS | Status: DC | PRN
  Administered 2017-03-08 (×2): via INTRAVENOUS

## 2017-03-08 MED ORDER — CEFAZOLIN SODIUM-DEXTROSE 2-4 GM/100ML-% IV SOLN
2.0000 g | Freq: Four times a day (QID) | INTRAVENOUS | Status: AC
  Administered 2017-03-08 – 2017-03-09 (×3): 2 g via INTRAVENOUS
  Filled 2017-03-08 (×3): qty 100

## 2017-03-08 MED ORDER — METOCLOPRAMIDE HCL 5 MG/ML IJ SOLN: 5.0000 mg | Freq: Three times a day (TID) | INTRAMUSCULAR | Status: DC | PRN

## 2017-03-08 MED ORDER — FENTANYL CITRATE (PF) 100 MCG/2ML IJ SOLN
INTRAMUSCULAR | Status: DC | PRN
  Administered 2017-03-08 (×2): 50 ug via INTRAVENOUS

## 2017-03-08 MED ORDER — HYDROMORPHONE HCL 1 MG/ML IJ SOLN: 0.5000 mg | INTRAMUSCULAR | Status: DC | PRN

## 2017-03-08 MED ORDER — TRANEXAMIC ACID 1000 MG/10ML IV SOLN
INTRAVENOUS | Status: AC
  Filled 2017-03-08: qty 10

## 2017-03-08 MED ORDER — METOCLOPRAMIDE HCL 10 MG PO TABS: 5.0000 mg | ORAL_TABLET | Freq: Three times a day (TID) | ORAL | Status: DC | PRN

## 2017-03-08 MED ORDER — SODIUM CHLORIDE 0.9 % IV SOLN
INTRAVENOUS | Status: DC | PRN
  Administered 2017-03-08: 30 ug/min via INTRAVENOUS

## 2017-03-08 MED ORDER — NEOMYCIN-POLYMYXIN B GU 40-200000 IR SOLN
Status: AC
  Filled 2017-03-08: qty 20

## 2017-03-08 MED ORDER — OXYCODONE HCL 5 MG PO TABS: 5.0000 mg | ORAL_TABLET | Freq: Once | ORAL | Status: DC | PRN

## 2017-03-08 MED ORDER — ONDANSETRON HCL 4 MG/2ML IJ SOLN: 4.0000 mg | Freq: Four times a day (QID) | INTRAMUSCULAR | Status: DC | PRN

## 2017-03-08 MED ORDER — OXYCODONE HCL 5 MG PO TABS: 10.0000 mg | ORAL_TABLET | ORAL | Status: DC | PRN

## 2017-03-08 MED ORDER — SUGAMMADEX SODIUM 200 MG/2ML IV SOLN
INTRAVENOUS | Status: DC | PRN
  Administered 2017-03-08: 127 mg via INTRAVENOUS

## 2017-03-08 MED ORDER — MIDAZOLAM HCL 2 MG/2ML IJ SOLN
INTRAMUSCULAR | Status: AC
  Filled 2017-03-08: qty 2

## 2017-03-08 MED ORDER — SODIUM CHLORIDE 0.9 % IV BOLUS (SEPSIS)
500.0000 mL | Freq: Once | INTRAVENOUS | Status: AC
  Administered 2017-03-08: 500 mL via INTRAVENOUS

## 2017-03-08 MED ORDER — DIPHENHYDRAMINE HCL 12.5 MG/5ML PO ELIX: 12.5000 mg | ORAL_SOLUTION | ORAL | Status: DC | PRN

## 2017-03-08 MED ORDER — OXYCODONE HCL 5 MG/5ML PO SOLN: 5.0000 mg | Freq: Once | ORAL | Status: DC | PRN

## 2017-03-08 MED ORDER — SUGAMMADEX SODIUM 200 MG/2ML IV SOLN
INTRAVENOUS | Status: AC
  Filled 2017-03-08: qty 2

## 2017-03-08 MED ORDER — PROPOFOL 10 MG/ML IV BOLUS
INTRAVENOUS | Status: DC | PRN
  Administered 2017-03-08: 120 mg via INTRAVENOUS

## 2017-03-08 MED ORDER — METHOCARBAMOL 1000 MG/10ML IJ SOLN
500.0000 mg | Freq: Four times a day (QID) | INTRAMUSCULAR | Status: DC | PRN
  Filled 2017-03-08: qty 5

## 2017-03-08 MED ORDER — PROMETHAZINE HCL 25 MG/ML IJ SOLN
INTRAMUSCULAR | Status: AC
  Administered 2017-03-08: 6.25 mg via INTRAVENOUS
  Filled 2017-03-08: qty 1

## 2017-03-08 MED ORDER — TRANEXAMIC ACID 1000 MG/10ML IV SOLN
INTRAVENOUS | Status: DC | PRN
  Administered 2017-03-08: 1000 mg via INTRAVENOUS

## 2017-03-08 MED ORDER — ROCURONIUM BROMIDE 50 MG/5ML IV SOLN
INTRAVENOUS | Status: AC
  Filled 2017-03-08: qty 1

## 2017-03-08 MED ORDER — VANCOMYCIN HCL 1000 MG IV SOLR
INTRAVENOUS | Status: AC
  Filled 2017-03-08: qty 1000

## 2017-03-08 MED ORDER — SEVOFLURANE IN SOLN
RESPIRATORY_TRACT | Status: AC
  Filled 2017-03-08: qty 250

## 2017-03-08 MED ORDER — ROCURONIUM BROMIDE 100 MG/10ML IV SOLN
INTRAVENOUS | Status: DC | PRN
  Administered 2017-03-08: 10 mg via INTRAVENOUS
  Administered 2017-03-08 (×2): 20 mg via INTRAVENOUS
  Administered 2017-03-08: 10 mg via INTRAVENOUS
  Administered 2017-03-08: 50 mg via INTRAVENOUS

## 2017-03-08 MED ORDER — METHOCARBAMOL 500 MG PO TABS: 500.0000 mg | ORAL_TABLET | Freq: Four times a day (QID) | ORAL | Status: DC | PRN

## 2017-03-08 MED ORDER — SENNOSIDES-DOCUSATE SODIUM 8.6-50 MG PO TABS
1.0000 | ORAL_TABLET | Freq: Every evening | ORAL | Status: DC | PRN
Start: 2017-03-08 — End: 2017-03-09

## 2017-03-08 MED ORDER — BUPIVACAINE HCL (PF) 0.5 % IJ SOLN
INTRAMUSCULAR | Status: DC | PRN
  Administered 2017-03-08: 6 mL via PERINEURAL
  Administered 2017-03-08: 4 mL via PERINEURAL

## 2017-03-08 MED ORDER — SODIUM CHLORIDE FLUSH 0.9 % IV SOLN
INTRAVENOUS | Status: AC
  Filled 2017-03-08: qty 10

## 2017-03-08 MED ORDER — OXYCODONE HCL 5 MG PO TABS
5.0000 mg | ORAL_TABLET | ORAL | Status: DC | PRN
  Administered 2017-03-09: 5 mg via ORAL
  Filled 2017-03-08: qty 1

## 2017-03-08 MED ORDER — MENTHOL 3 MG MT LOZG
1.0000 | LOZENGE | OROMUCOSAL | Status: DC | PRN
  Filled 2017-03-08: qty 9

## 2017-03-08 MED ORDER — PROMETHAZINE HCL 25 MG/ML IJ SOLN
6.2500 mg | Freq: Once | INTRAMUSCULAR | Status: AC
  Administered 2017-03-08: 6.25 mg via INTRAVENOUS

## 2017-03-08 MED ORDER — PHENOL 1.4 % MT LIQD
1.0000 | OROMUCOSAL | Status: DC | PRN
  Filled 2017-03-08: qty 177

## 2017-03-08 MED ORDER — MIDAZOLAM HCL 2 MG/2ML IJ SOLN
INTRAMUSCULAR | Status: AC
  Administered 2017-03-08: 1 mg via INTRAVENOUS
  Filled 2017-03-08: qty 2

## 2017-03-08 MED ORDER — TRANEXAMIC ACID 1000 MG/10ML IV SOLN
1000.0000 mg | Freq: Once | INTRAVENOUS | Status: AC
  Administered 2017-03-08: 1000 mg via INTRAVENOUS
  Filled 2017-03-08: qty 10

## 2017-03-08 MED ORDER — PHENYLEPHRINE HCL 10 MG/ML IJ SOLN
INTRAMUSCULAR | Status: AC
  Filled 2017-03-08: qty 1

## 2017-03-08 MED ORDER — ASPIRIN EC 325 MG PO TBEC
325.0000 mg | DELAYED_RELEASE_TABLET | Freq: Every day | ORAL | Status: DC
  Administered 2017-03-09: 325 mg via ORAL
  Filled 2017-03-08: qty 1

## 2017-03-08 MED ORDER — FENTANYL CITRATE (PF) 100 MCG/2ML IJ SOLN
50.0000 ug | Freq: Once | INTRAMUSCULAR | Status: AC
  Administered 2017-03-08: 50 ug via INTRAVENOUS

## 2017-03-08 MED ORDER — FENTANYL CITRATE (PF) 100 MCG/2ML IJ SOLN
INTRAMUSCULAR | Status: AC
  Administered 2017-03-08: 50 ug via INTRAVENOUS
  Filled 2017-03-08: qty 2

## 2017-03-08 MED ORDER — LIDOCAINE HCL (PF) 1 % IJ SOLN
INTRAMUSCULAR | Status: AC
  Filled 2017-03-08: qty 5

## 2017-03-08 MED ORDER — VANCOMYCIN HCL 1000 MG IV SOLR
INTRAVENOUS | Status: DC | PRN
  Administered 2017-03-08: 1000 mg via TOPICAL

## 2017-03-08 MED ORDER — LACTATED RINGERS IV SOLN
INTRAVENOUS | Status: DC
  Administered 2017-03-08: 09:00:00 via INTRAVENOUS

## 2017-03-08 MED ORDER — BUPIVACAINE LIPOSOME 1.3 % IJ SUSP
INTRAMUSCULAR | Status: DC | PRN
  Administered 2017-03-08: 14 mL via PERINEURAL
  Administered 2017-03-08: 6 mL via PERINEURAL

## 2017-03-08 MED ORDER — PHENYLEPHRINE HCL 10 MG/ML IJ SOLN
INTRAMUSCULAR | Status: DC | PRN
  Administered 2017-03-08 (×5): 100 ug via INTRAVENOUS

## 2017-03-08 SURGICAL SUPPLY — 75 items
BIT DRILL 2.5 (BIT) ×1
BIT DRILL 3.1 (BIT) ×2 IMPLANT
BIT DRILL SHRT 216X2.5XNS LF (BIT) ×1 IMPLANT
BIT DRL SHRT 216X2.5XNS LF (BIT) ×1
BLADE OSC/SAGITTAL MD 5.5X18 (BLADE) ×2 IMPLANT
BONE CANC CHIPS 20CC PCAN1/4 (Bone Implant) ×2 IMPLANT
CANISTER SUCT 1200ML W/VALVE (MISCELLANEOUS) ×2 IMPLANT
CANISTER SUCT 3000ML PPV (MISCELLANEOUS) ×2 IMPLANT
CEMENT INJ HYDROSET XT (Cement) ×2 IMPLANT
CHIPS CANC BONE 20CC PCAN1/4 (Bone Implant) ×1 IMPLANT
CHLORAPREP W/TINT 26ML (MISCELLANEOUS) ×2 IMPLANT
CNTNR SPEC 2.5X3XGRAD LEK (MISCELLANEOUS) ×2
CONT SPEC 4OZ STER OR WHT (MISCELLANEOUS) ×2
CONTAINER SPEC 2.5X3XGRAD LEK (MISCELLANEOUS) ×2 IMPLANT
COOLER POLAR GLACIER W/PUMP (MISCELLANEOUS) ×2 IMPLANT
DRAPE C-ARM 42X72 X-RAY (DRAPES) ×4 IMPLANT
DRAPE C-ARM XRAY 36X54 (DRAPES) ×4 IMPLANT
DRAPE IMP U-DRAPE 54X76 (DRAPES) ×4 IMPLANT
DRAPE INCISE IOBAN 66X45 STRL (DRAPES) ×4 IMPLANT
DRAPE SHEET LG 3/4 BI-LAMINATE (DRAPES) ×4 IMPLANT
DRSG OPSITE POSTOP 4X8 (GAUZE/BANDAGES/DRESSINGS) ×2 IMPLANT
DRSG TEGADERM 2-3/8X2-3/4 SM (GAUZE/BANDAGES/DRESSINGS) ×2 IMPLANT
ELECT CAUTERY BLADE 6.4 (BLADE) ×2 IMPLANT
ELECT REM PT RETURN 9FT ADLT (ELECTROSURGICAL) ×2
ELECTRODE REM PT RTRN 9FT ADLT (ELECTROSURGICAL) ×1 IMPLANT
EVACUATOR 1/8 PVC DRAIN (DRAIN) ×2 IMPLANT
GAUZE PETRO XEROFOAM 1X8 (MISCELLANEOUS) ×2 IMPLANT
GAUZE SPONGE NON-WVN 2X2 STRL (MISCELLANEOUS) ×1 IMPLANT
GLOVE BIOGEL PI IND STRL 8 (GLOVE) IMPLANT
GLOVE BIOGEL PI INDICATOR 8 (GLOVE)
GLOVE SURG ORTHO 8.0 STRL STRW (GLOVE) ×4 IMPLANT
GOWN STRL REUS W/ TWL LRG LVL3 (GOWN DISPOSABLE) ×1 IMPLANT
GOWN STRL REUS W/ TWL XL LVL3 (GOWN DISPOSABLE) ×1 IMPLANT
GOWN STRL REUS W/TWL LRG LVL3 (GOWN DISPOSABLE) ×1
GOWN STRL REUS W/TWL XL LVL3 (GOWN DISPOSABLE) ×1
K-WIRE 2.0 (WIRE) ×3
K-WIRE FX234X2XDRL TIP NS (WIRE) ×3
KIT STABILIZATION SHOULDER (MISCELLANEOUS) ×2 IMPLANT
KIT TURNOVER KIT A (KITS) ×2 IMPLANT
KWIRE FX234X2XDRL TIP NS (WIRE) ×3 IMPLANT
MARKER SKIN DUAL TIP RULER LAB (MISCELLANEOUS) ×2 IMPLANT
MASK FACE SPIDER DISP (MASK) ×2 IMPLANT
MAT ABSORB  FLUID 56X50 GRAY (MISCELLANEOUS) ×2
MAT ABSORB FLUID 56X50 GRAY (MISCELLANEOUS) ×2 IMPLANT
NEEDLE HYPO 25X1 1.5 SAFETY (NEEDLE) ×2 IMPLANT
NS IRRIG 1000ML POUR BTL (IV SOLUTION) ×2 IMPLANT
PACK ARTHROSCOPY SHOULDER (MISCELLANEOUS) ×2 IMPLANT
PAD WRAPON POLAR SHDR UNIV (MISCELLANEOUS) ×1 IMPLANT
PLATE PROXIMAL LAT HUM 3H RT (Plate) ×2 IMPLANT
RETRIEVER SUT HEWSON (MISCELLANEOUS) ×2 IMPLANT
SCREW BONE LOCK 4.0X26MM (Screw) ×4 IMPLANT
SCREW BONE LOCKING 4.0X42MM (Screw) ×2 IMPLANT
SCREW BONE LOCKING 4X38MM S/T (Screw) ×4 IMPLANT
SCREW CORT 30X3.5XST LCK NS (Screw) ×1 IMPLANT
SCREW CORTICAL 3.5X30MM (Screw) ×1 IMPLANT
SCREW LOCKING 4.0MMX40MM (Screw) ×2 IMPLANT
SCREW LOCKING 4.0X44MM (Screw) ×2 IMPLANT
SCREW LOCKING 4.0X46MM (Screw) ×2 IMPLANT
SCREW LOCKING 4.0X50MM (Screw) ×2 IMPLANT
SCREW LOCKING 4MMX48MM SELFTAP (Screw) ×2 IMPLANT
SLING ULTRA II M (MISCELLANEOUS) ×2 IMPLANT
SOL .9 NS 3000ML IRR  AL (IV SOLUTION)
SOL .9 NS 3000ML IRR UROMATIC (IV SOLUTION) IMPLANT
SPONGE LAP 18X18 5 PK (GAUZE/BANDAGES/DRESSINGS) ×4 IMPLANT
SPONGE VERSALON 2X2 STRL (MISCELLANEOUS) ×1
STAPLER SKIN PROX 35W (STAPLE) IMPLANT
STRAP SAFETY 5IN WIDE (MISCELLANEOUS) ×2 IMPLANT
SUT ETHIBOND #5 BRAIDED 30INL (SUTURE) ×6 IMPLANT
SUT FIBERWIRE #2 38 BLUE 1/2 (SUTURE) ×10
SUT TICRON 2-0 30IN 311381 (SUTURE) ×4 IMPLANT
SUT VIC AB 0 CT1 36 (SUTURE) ×2 IMPLANT
SUT VIC AB 2-0 CT2 27 (SUTURE) ×4 IMPLANT
SUTURE FIBERWR #2 38 BLUE 1/2 (SUTURE) ×5 IMPLANT
TRAY FOLEY CATH SILVER 16FR LF (SET/KITS/TRAYS/PACK) ×2 IMPLANT
WRAPON POLAR PAD SHDR UNIV (MISCELLANEOUS) ×2

## 2017-03-08 NOTE — H&P (Signed)
Paper H&P to be scanned into permanent record. H&P reviewed. No significant changes noted.  

## 2017-03-08 NOTE — Op Note (Signed)
Operative Note    SURGERY DATE: 03/08/2017   PRE-OP DIAGNOSIS:  1. Right proximal humerus fracture   POST-OP DIAGNOSIS:  1. Right proximal humerus fracture 2. Right Biceps tendinopathy   PROCEDURES:  1. ORIF R proximal humerus 2. Right open biceps tenodesis   SURGEON: Cato Mulligan, MD   ANESTHESIA: Gen + interscalene block   ESTIMATED BLOOD LOSS: 200cc   TOTAL IV FLUIDS: see anesthesia record  IMPLANTS: Stryker AxSOS 3 hole proximal humerus locking plate with associated locking and cortical screws in the humeral shaft and locking screws in the humeral head   INDICATION(S):  Maureen Anderson is a 57 y.o. female who sustained a displaced proximal humerus fracture after a fall off a high bed while on vacation.  After discussion of risks, benefits, and alternatives to surgery, the patient elected to proceed with ORIF L proximal humerus and possible biceps tenodesis.    OPERATIVE FINDINGS: 3-part varus proximal humerus fracture, biceps tendinopathy   OPERATIVE REPORT:   I identified Luz Brazen in the pre-operative holding area. Informed consent was obtained and the surgical site was marked. I reviewed the risks and benefits of the proposed surgical intervention and the patient (and/or patient's guardian) wished to proceed. An interscalene block with Exparil was administered by the Anesthesia team. The patient was transferred to the operative suite and general anesthesia was administered. The patient was placed in the beach chair position with the head of the bed elevated approximately 45 degrees. All down side pressure points were appropriately padded. Appropriate IV antibiotics were administered within 30 minutes before incision. The extremity was then prepped and draped in standard fashion. A time out was performed confirming the correct extremity, correct patient, and correct procedure.   We used the standard deltopectoral incision from the coracoid to ~12cm distal. We found the  cephalic vein and took it laterally. We opened the deltopectoral interval widely and placed retractors under the CA ligament in the subacromial space and under the deltoid tendon at its insertion. We released the underlying hemorrhagic bursa between these retractors, taking care not to damage the circumflex branch of the axillary nerve.   We opened the clavipectoral fascia lateral to the conjoint tendon. We cut the falciform ligament at approximately 1 cm of the upper portion of the pectoralis major insertion. Next we unroofed the bicipital groove. The biceps tendon was inflamed and tendinopathic.   Sutures with #5 Ethibond were placed in the subscapularis, supraspinatus, and infraspinatus tendons. Two 2.0 threaded K-wires were placed into the humeral head to assist with reduction. A wide cobb elevator was placed through the lateral fracture line on the medial aspect of the inferior humeral head. A combination of elevating with the Cobb and using the K-wires reduced the head out of varus and posterior tilt. Fluoroscopy was used to confirm appropriate reduction in orthogonal planes. K-wires from the anterior shaft to humeral head were used to hold the reduction. There was a significant bony void. 5cc of HydroSet (calcium phosphate) were used to fill and support the medial calcar region. Cancellous bone chips were used to backfill the void on the lateral aspect.   A Stryker proximal humeral locking plate was selected and position was confirmed radiographically. A suture passing device was used to pass the rotator cuff sutures into the locking plate after placing K-wires to hold the plate in place. Proximal locking screws, including inferior calcar screws were placed. Next, two additional locking screws were placed in the humeral shaft at the distal  two holes. Locking screws were used here to prevent lateralized the humeral shaft. A cortical screw was placed in the oblong hole after locking screws were placed.    We proceeded with a soft tissue biceps tenodesis given the pathology of the tendon.  After opening the biceps tendon sheath all the way to the supraglenoid tubercle, we performed a biceps tenodesis with two #2 TiCron sutures to the upper border of the pectoralis major. The proximal portion of the tendon was excised. The rotator interval was closed with two #2 Ticron sutures in a figure of 8 fashion.   Fluoroscopy was used to confirm reduction and hardware position in both the AP and axillary views. Live fluoro was used to confirm that there was no screw penetration into the joint. We then tied the subscapularis, supraspinatus, and infraspinatus sutures into the plate. On range of motion, the humeral head and shaft moved as a unit without excess impingement. The wound was irrigated. Vancomycin powder was placed.   A Hemovac drain was placed.  We closed the deltopectoral interval with an 0-Vicryl. The skin was closed with 2-0 Vicryl and staples. Xeroform and Honeycomb dressing was applied. A PolarCare unit and sling were placed. Patient was extubated, transferred to a stretcher bed and to the post anesthesia care unit in stable condition.   POSTOPERATIVE PLAN: The patient will be admitted with plan for discharge home on POD#1. Operative arm to remain in sling at all times except RoM exercises and hygiene. 130 FF and 30 ER passive RoM allowed. Outpatient PT to start in 3-4 days. ASA 325mg  x 6 weeks for DVT ppx. Patient to return to clinic in ~2 weeks for post-operative appointment.

## 2017-03-08 NOTE — Anesthesia Procedure Notes (Signed)
Anesthesia Regional Block: Interscalene brachial plexus block   Pre-Anesthetic Checklist: ,, timeout performed, Correct Patient, Correct Site, Correct Laterality, Correct Procedure, Correct Position, site marked, Risks and benefits discussed,  Surgical consent,  Pre-op evaluation,  At surgeon's request and post-op pain management  Laterality: Upper and Right  Prep: chloraprep       Needles:  Injection technique: Single-shot  Needle Type: Stimiplex     Needle Length: 5cm  Needle Gauge: 22     Additional Needles:   Narrative:  Start time: 03/08/2017 9:36 AM End time: 03/08/2017 9:39 AM Injection made incrementally with aspirations every 5 mL.  Performed by: Personally  Anesthesiologist: Piscitello, Precious Haws, MD  Additional Notes: Functioning IV was confirmed and monitors were applied.  A 43mm 22ga Stimuplex needle was used. Sterile prep,hand hygiene and sterile gloves were used.  Minimal sedation used for procedure.  No paresthesia endorsed by patient during injection during the procedure.  Negative aspiration and negative test dose prior to incremental administration of local anesthetic. The patient tolerated the procedure well with no immediate complications.

## 2017-03-08 NOTE — Discharge Instructions (Signed)
Maureen Fanguy H. [name], MD ° [facility] ° Phone: [phone] ° Fax: [phone] ° ° °Discharge Instructions after ORIF Proximal Humerus ° °  °1. Activity/Sling: You are to be non-weight bearing on operative extremity. A sling/shoulder immobilizer has been provided for you. Only remove the sling to perform elbow, wrist, and hand RoM exercises and hygiene/dressing. Active reaching and lifting are not permitted. You will be given further instructions on sling use at your first physical therapy visit and postoperative visit with Dr. Ingram Anderson.  ° °2. Dressings: Dressing may be removed at 1st physical therapy visit (~3-4 days after surgery). Afterwards, you may either leave open to air (if no drainage) or cover with dry, sterile dressing. If you have steri-strips on your wound, please do not remove them. They will fall off on their own. You may shower 5 days after surgery. Please pat incision dry. Do not rub or place any shear forces across incision. If there is drainage or any opening of incision after 5 days, please notify our offices immediately.  °  °3. Driving:  Plan on not driving for six weeks. Please note that you are advised NOT to drive while taking narcotic pain medications as you may be impaired and unsafe to drive. °  °4. Medications:  °- You have been provided a prescription for narcotic pain medicine (usually oxycodone). After surgery, take 1-2 narcotic tablets every 4 hours if needed for severe pain. Please start this as soon as you begin to start having pain (if you received a nerve block, start taking as soon as this wears off).  °- A prescription for anti-nausea medication will be provided in case the narcotic medicine causes nausea - take 1 tablet every 6 hours only if nauseated.  °- Take enteric coated aspirin 325 mg once daily for 6 weeks to prevent blood clots. Do not take aspirin if you have an aspirin sensitivity/allergy or asthma or are on an anticoagulant (blood thinner) already. If so, then your  home anticoagulant will be resume and managed - do not take aspirin. °-Take tylenol 1000mg (2 Extra strength or 3 regular strength tablets) every 8 hours for pain. This will reduce the amount of narcotic medication needed. May stop tylenol when you are having minimal pain. °- Take a stool softener (Colace, Dulcolax or Senakot) if you are using narcotic pain medications to help with constipation that is associated with narcotic use. °- DO NOT take ANY nonsteroidal anti-inflammatory pain medications: °Advil, Motrin, Ibuprofen, Aleve, Naproxen, or Naprosyn.  ° °If you are taking prescription medication for anxiety, depression, insomnia, muscle spasm, chronic pain, or for attention deficit disorder you are advised that you are at a higher risk of adverse effects with use of narcotics post-op, including narcotic addiction/dependence, depressed breathing, death. °If you use non-prescribed substances: alcohol, marijuana, cocaine, heroin, methamphetamines, etc., you are at a higher risk of adverse effects with use of narcotics post-op, including narcotic addiction/dependence, depressed breathing, death. °You are advised that taking > 50 morphine milligram equivalents (MME) of narcotic pain medication per day results in twice the risk of overdose or death. For your prescription provided: oxycodone 5 mg - taking more than 6 tablets per day after the first few days of surgery. °  °5. Physical Therapy: 1-2 times per week for ~12 weeks. Therapy typically starts on post operative Day 3 or 4. You have been provided an order for physical therapy. The therapist will provide home exercises. Please contact our offices if this appointment has not been scheduled.  °  °6. Work: May do   light duty/desk job in approximately 2 weeks when off of narcotics, pain is well-controlled, and swelling has decreased if able to function with one arm in sling. Full work may take 6 weeks if light motions and function of both arms is required. Lifting  jobs may require 12 weeks. °  °7. Post-Op Appointments: °Your first post-op appointment will be with Dr. Cornisha Anderson in approximately 2 weeks time.  °  °If you find that they have not been scheduled please call the Orthopaedic Appointment front desk at [phone]. ° ° ° ° ° ° ° ° ° ° ° ° ° ° ° ° ° ° ° ° ° ° ° ° ° ° ° ° ° ° °Proximal Humeral Fracture with Open Reduction Internal Fixation Rehabilitation Framework  °The following is a basic framework from which to work during rehabilitation following open reduction and internal fixation of proximal humeral fractures. However, it is critical to communicate with the surgeon in order to be aware of the quality of the bone and fracture repair, any concomitant procedures that might have been performed, etc, that might impact the progression that is appropriate for each specific patient.  ° °Safe Zones established intraoperatively by the surgeon  °• _These ranges can start on Post-op day 1, but may require a few weeks to achieve depending on patient comfort  °• _Passive range of motion limits:  °• _130/30 Program: Max. forward flexion to 130o ; Max. External rotation to 30o  °• _No abduction  ° °If concomitant biceps tenodesis is done with ORIF for proximal humeral fractures, avoid resistance to elbow flexion for 6 weeks, and for the initial couple of weeks, have elbow flexion/extension range of motion be supported by the well arm.  ° °Phase I: Passive Motion - 0-6 weeks post-op  °Goals:  °• _PROM - 140/130 degrees of flexion, ER of 40/30 by the end of week 6 (see above)  °• _Decrease pain, Decrease muscle atrophy, Educate regarding joint protection  °• _Provide the patient with instructions for home exercises 3-5 x per day  ° °Precautions:  °• _Stay within intraoperative determined safe zone for motion °• _Sling with abduction pillow at all times, removed only for 3-5x/day exercises, showering, and dressing  ° °Teaching:  °• _Emphasize home, passive well-arm assisted PROM (FF  and ER as above)  °• _Instruct in regular icing techniques or cold therapy device (use as much as possible out of 24 hours for 8-10 days)  °• _Ice packs for 20 - 30 minutes intervals, especially at the end of an exercise session  °• _Monitor for arm, hand, or finger edema ° °Exercises:  °• _Pendulums _ °o With the arm hanging, the patient gently swings the hand forward and backward, then side-to-side, and then clockwise and counterclockwise  °• _Passive, forward flexion, in front of the plane of the scapula as pain allows per safe zone above (140/40 or 130/30): supine well arm, table slides, or table walk back motion all allowed  °• _Passive external rotation with the arm supported in the plane of the scapula: may be supine with cane assistance, seated and supported on arm rest with motion performed by well arm; or propped on counter top and step around  °• _Active scapular retraction, elevation in sitting or standing  °• _Active elbow, wrist, hand ROM - Grasping and gripping lightweight objects  °Phase II: Active Range of Motion (6-10 weeks post-op)  °Goals:  °• _Full range of motion by end of week 10. After 6 week physician visit, patient   and therapist can move beyond the safe zones as pain allows if radiographic evidence supports sufficient healing.  °• _Emphasis should on range of motion before strengthening.  °• _Improve strength, Decrease pain, Increase functional activities, Scapular stabilization  ° °Precautions:  °• _No sling use ° °Teaching:  °• _Encourage continued stretching at home. Limited only by pain  °• _Ice after exercise as needed.  ° °Exercises:  °• _Encourage patient to use smooth, normal movement patterns °• _Continue to work on Passive ROM as in Phase I and progress beyond precautionary range limits  °• _Begin AROM and AAROM (using a cane), progressively, to full range of motion when passive motion is normalized - progress active motion to reclined then sitting position  °• _Begin IR with hand  slide up spine, sleeper stretch  °• _Side lying ER against gravity °• _Encourage normal scapular mechanics with active motion  °• _Add Theraband exercises or light dumbbell weights (2lbs) for flexion, extension, external rotation after passive and active motion is restored  °• _Scapulothroacic strengthening (prone extension, prone T, etc)_ °• _Aquatic therapy, if available, can begin no earlier than 1 month post op if wound is completely healed.  °o Week 4-6: Stay within established safe zone listed above. Passive motion only  °o Week 6 +: Shoulder fully submerged - slow, active motions for flexion, elevation, ER/IR and horizontal abduction/adduction out to scapular plane, range of motion limited by pain only.  ° °Phase III: Final Strengthening - 10+ weeks  °Goals:  °• _If acceptable motion has been achieved (>160 FF, >60 ER, IR T12 or above), then Maximize strength--otherwise continue with stretching program  °• _Improve neuromuscular control °• _Increase functional activities _ ° °Precautions:  °• _No sudden, forceful resisted IR (golfing, wood splitting, swimming, etc) until >3 months postop ° °Teaching:  °• _Continue home stretching minimum 1x per day to maintain full range of motion  ° °Exercises:  °• _Continue to increase difficulty of theraband and dumbbell exerscises as tolerated °• _Increase resistance exercises - must be light enough weight that >20 reps are achieved per set  °• _Continue aerobic training as tolerated, and modalities as appropriate  °• _Continue to progress home program °NOTES:  °1. With proper exercise, motion, strength, and function continue to improve even after one year.  °2. The therapy plan above only serves as a guide. Please be aware of specific individualized patient instructions as written on the prescription or through discussions with the surgeon.  °3. The patient’s “Home exercise stretching program” (critical for first 10 weeks) is attached.   ° ° ° ° ° ° ° ° ° ° ° ° ° ° ° ° ° ° ° ° ° ° ° ° ° ° ° ° ° ° ° ° ° ° ° ° ° ° ° ° ° ° °Home Exercises ° °Perform passive, assisted forward flexion and external rotation (outward turning) exercises with the operative arm. You were taught these exercises prior to discharge. Both exercises should be done with the non-operative arm used as the "therapist arm" while the operative arm remains completely relaxed.  ° °10 of each exercise should be done 5 times daily, work up to the max degrees  ° °Forward Flexion  °Maximum: ____130__ deg.  °Lie flat on your back, completely relax your operative arm like a wet noodle, and grasp the wrist of the operative shoulder with your opposite hand. Using the power in your opposite arm, bring the stiff arm up only to the maximum indicated above (  90 degrees indicates your arm pointed straight ahead). Start holding it for ten seconds and then work up to where you can hold it for a count of 30. Breathe slowly and deeply while the arm is moved. Repeat this stretch ten times.  ° ° °External rotation Maximum: __30____ deg.  °External rotation is turning the arm out to the side while your elbow stays close to your body. It is best stretched while you are lying on your back. Hold a cane, yardstick, broom handle, or golf club in both hands. Bend both elbows to a right angle. With your operative arm completely relaxed, use steady, gentle force from your normal arm to rotate the hand of the stiff shoulder out away from your body. Continue the rotation only to the maximum indicated above (90 degrees indicates your arm pointed straight ahead). Holding it there for a count  °of 10. Repeat this exercise ten times.  ° ° °

## 2017-03-08 NOTE — Anesthesia Post-op Follow-up Note (Signed)
Anesthesia QCDR form completed.        

## 2017-03-08 NOTE — Transfer of Care (Signed)
Immediate Anesthesia Transfer of Care Note  Patient: Maureen Anderson  Procedure(s) Performed: OPEN REDUCTION INTERNAL FIXATION (ORIF) PROXIMAL HUMERUS FRACTURE (Right )  Patient Location: PACU  Anesthesia Type:General  Level of Consciousness: sedated  Airway & Oxygen Therapy: Patient Spontanous Breathing and Patient connected to face mask oxygen  Post-op Assessment: Report given to RN and Post -op Vital signs reviewed and stable  Post vital signs: Reviewed and stable  Last Vitals:  Vitals:   03/08/17 0952 03/08/17 1527  BP: 97/72 108/68  Pulse: 63 98  Resp: 11 10  Temp:  36.5 C  SpO2: 99% 100%    Last Pain:  Vitals:   03/08/17 0820  TempSrc: Oral  PainSc: 2          Complications: No apparent anesthesia complications

## 2017-03-08 NOTE — Anesthesia Preprocedure Evaluation (Signed)
Anesthesia Evaluation  Patient identified by MRN, date of birth, ID band Patient awake    Reviewed: Allergy & Precautions, H&P , NPO status , Patient's Chart, lab work & pertinent test results  History of Anesthesia Complications Negative for: history of anesthetic complications  Airway Mallampati: II  TM Distance: >3 FB Neck ROM: full    Dental  (+) Chipped   Pulmonary neg pulmonary ROS, neg shortness of breath,           Cardiovascular Exercise Tolerance: Good (-) angina(-) Past MI and (-) DOE negative cardio ROS       Neuro/Psych negative neurological ROS  negative psych ROS   GI/Hepatic negative GI ROS, Neg liver ROS, neg GERD  ,  Endo/Other  negative endocrine ROS  Renal/GU      Musculoskeletal   Abdominal   Peds  Hematology negative hematology ROS (+)   Anesthesia Other Findings Past Medical History: 2014: Melanoma ([place]) No date: Urinary tract infection  Past Surgical History: No date: CESAREAN SECTION     Comment:  x2 09/2015: COLONOSCOPY     Comment:  normal  No date: MELANOMA EXCISION  BMI    Body Mass Index:  21.29 kg/m      Reproductive/Obstetrics negative OB ROS                             Anesthesia Physical Anesthesia Plan  ASA: II  Anesthesia Plan: General ETT   Post-op Pain Management: GA combined w/ Regional for post-op pain   Induction: Intravenous  PONV Risk Score and Plan: Ondansetron, Dexamethasone and Midazolam  Airway Management Planned: Oral ETT  Additional Equipment:   Intra-op Plan:   Post-operative Plan: Extubation in OR  Informed Consent: I have reviewed the patients History and Physical, chart, labs and discussed the procedure including the risks, benefits and alternatives for the proposed anesthesia with the patient or authorized representative who has indicated his/her understanding and acceptance.   Dental Advisory  Given  Plan Discussed with: Anesthesiologist, CRNA and Surgeon  Anesthesia Plan Comments: (Patient consented for risks of anesthesia including but not limited to:  - adverse reactions to medications - damage to teeth, lips or other oral mucosa - sore throat or hoarseness - Damage to heart, brain, lungs or loss of life  Patient voiced understanding.)        Anesthesia Quick Evaluation

## 2017-03-08 NOTE — Progress Notes (Signed)
PB 90/54. Dr. Rudene Christians notified. New order obtained.

## 2017-03-08 NOTE — Anesthesia Procedure Notes (Signed)
Procedure Name: Intubation Date/Time: 03/08/2017 10:30 AM Performed by: Justus Memory, CRNA Pre-anesthesia Checklist: Patient identified, Patient being monitored, Timeout performed, Emergency Drugs available and Suction available Patient Re-evaluated:Patient Re-evaluated prior to induction Oxygen Delivery Method: Circle system utilized Preoxygenation: Pre-oxygenation with 100% oxygen Induction Type: IV induction Ventilation: Mask ventilation without difficulty Laryngoscope Size: Mac and 3 Grade View: Grade I Tube type: Oral Tube size: 7.0 mm Number of attempts: 1 Airway Equipment and Method: Stylet Placement Confirmation: ETT inserted through vocal cords under direct vision,  positive ETCO2 and breath sounds checked- equal and bilateral Secured at: 20 cm Tube secured with: Tape Dental Injury: Teeth and Oropharynx as per pre-operative assessment

## 2017-03-09 DIAGNOSIS — S42201A Unspecified fracture of upper end of right humerus, initial encounter for closed fracture: Secondary | ICD-10-CM | POA: Diagnosis not present

## 2017-03-09 LAB — BASIC METABOLIC PANEL
ANION GAP: 6 (ref 5–15)
BUN: 11 mg/dL (ref 6–20)
CO2: 26 mmol/L (ref 22–32)
Calcium: 8.6 mg/dL — ABNORMAL LOW (ref 8.9–10.3)
Chloride: 106 mmol/L (ref 101–111)
Creatinine, Ser: 0.74 mg/dL (ref 0.44–1.00)
GFR calc Af Amer: >60 mL/min (ref >60)
GFR calc non Af Amer: >60 mL/min (ref >60)
GLUCOSE: 95 mg/dL (ref 65–99)
POTASSIUM: 3.8 mmol/L (ref 3.5–5.1)
Sodium: 138 mmol/L (ref 135–145)

## 2017-03-09 LAB — CBC
HEMATOCRIT: 29.8 % — AB (ref 35.0–47.0)
Hemoglobin: 10.2 g/dL — ABNORMAL LOW (ref 12.0–16.0)
MCH: 31.1 pg (ref 26.0–34.0)
MCHC: 34.4 g/dL (ref 32.0–36.0)
MCV: 90.4 fL (ref 80.0–100.0)
PLATELETS: 297 10*3/uL (ref 150–440)
RBC: 3.3 MIL/uL — AB (ref 3.80–5.20)
RDW: 13.2 % (ref 11.5–14.5)
WBC: 8.1 10*3/uL (ref 3.6–11.0)

## 2017-03-09 MED ORDER — OXYCODONE HCL 5 MG PO TABS: 5.0000 mg | ORAL_TABLET | ORAL | 0 refills | Status: DC | PRN

## 2017-03-09 MED ORDER — ONDANSETRON HCL 4 MG PO TABS: 4.0000 mg | ORAL_TABLET | Freq: Four times a day (QID) | ORAL | 0 refills | Status: DC | PRN

## 2017-03-09 MED ORDER — SODIUM CHLORIDE 0.9 % IV SOLN
INTRAVENOUS | Status: DC
  Administered 2017-03-09: 03:00:00 via INTRAVENOUS

## 2017-03-09 MED ORDER — ASPIRIN 325 MG PO TBEC: 325.0000 mg | DELAYED_RELEASE_TABLET | Freq: Every day | ORAL | 0 refills | Status: DC

## 2017-03-09 NOTE — Progress Notes (Signed)
PB 84/49. MD paged. Waiting for MD to call back.

## 2017-03-09 NOTE — Progress Notes (Signed)
Dr. Rudene Christians called back. New orders obtained.

## 2017-03-09 NOTE — Progress Notes (Signed)
Discharge instructions and medication details reviewed with patient and husband. Printed prescriptions given along with printed AVS. Patient is aware of follow up appointments. Patient also aware of outpatient PT appoinment on Friday.  IV removed. Patient was escorted out to the car.

## 2017-03-09 NOTE — Care Management Note (Signed)
Case Management Note  Patient Details  Name: Maureen Anderson MRN: [id number] Date of Birth: 11-05-1960  Subjective/Objective:                  RNCM met with patient prior to discharge. She lives with her husband however she states that their relationship is strained and she has limited help from him.  She asked that I talk with him about needing some assistance.  She states her mother will be available to help her tomorrow.  Ordinarily she is independent from home.  She is dominant right handed. She plans for outpatient therapy. She has follow up appointment with Dr. Posey Pronto on 03/24/17 at 1145AM.  Action/Plan: No RNCM needs. I did contact patient's husband Marguerite Olea [phone] and he is here to take patient home now.   Expected Discharge Date:  03/09/17               Expected Discharge Plan:     In-House Referral:     Discharge planning Services     Post Acute Care Choice:    Choice offered to:     DME Arranged:    DME Agency:     HH Arranged:    HH Agency:     Status of Service:     If discussed at [employer], dates discussed:    Additional Comments:  Marshell Garfinkel, RN 03/09/2017, 9:39 AM

## 2017-03-09 NOTE — Anesthesia Postprocedure Evaluation (Signed)
Anesthesia Post Note  Patient: Maureen Anderson  Procedure(s) Performed: OPEN REDUCTION INTERNAL FIXATION (ORIF) PROXIMAL HUMERUS FRACTURE (Right )  Patient location during evaluation: PACU Anesthesia Type: General Level of consciousness: awake and alert and oriented Pain management: pain level controlled Vital Signs Assessment: post-procedure vital signs reviewed and stable Respiratory status: spontaneous breathing Cardiovascular status: blood pressure returned to baseline Anesthetic complications: no     Last Vitals:  Vitals:   03/09/17 0735 03/09/17 0835  BP: (!) 94/57 108/60  Pulse: 69   Resp: 18   Temp: 36.9 C   SpO2: 100%     Last Pain:  Vitals:   03/09/17 0835  TempSrc:   PainSc: 6                  Hatsumi Steinhart

## 2017-03-09 NOTE — Evaluation (Signed)
Physical Therapy Evaluation Patient Details Name: Maureen Anderson MRN: [id number] DOB: 10-Mar-1960 Today's Date: 03/09/2017   History of Present Illness  Pt s/p R shoulder fx 2 weeks ago, had been in immobilizer.  Ultimately needed proximal humerus ORIF with bicep tenodesis  Clinical Impression  Pt very cautious about the whole situation, answered a lot of questions regarding expected recovery over the next few weeks and PT stressed the need to let the shoulder heal and maintaining immob/PROM w/o trying to get back to doing too much too early.  Pt showed good bed mobility, transfers, and did well with some mobility/gait training, stair training, precautions apart from the PT exam.  Also instructed on HEP for distal AROM exercises as well as shoulder PROM activities, pt pain limited and highly guarded with all PT controlled PROM acts and though she was willing to try did not tolerate more than 10 minutes of these secondary to guarding.  Pt may benefit from HHPT initially secondary to her guarding/caution/hesitancy, final disposition per surgeon.     Follow Up Recommendations DC plan and follow up therapy as arranged by surgeon;Outpatient PT;Home health PT    Equipment Recommendations  None recommended by PT    Recommendations for Other Services       Precautions / Restrictions Precautions Precautions: Shoulder Shoulder Interventions: Shoulder sling/immobilizer;At all times;Off for dressing/bathing/exercises Precaution Booklet Issued: Yes (comment) Restrictions Weight Bearing Restrictions: Yes RUE Weight Bearing: Non weight bearing      Mobility  Bed Mobility Overal bed mobility: Independent             General bed mobility comments: Pt able to get to sitting EOB w/o assist, some initial dizziness, but no safety issues otherwise  Transfers Overall transfer level: Modified independent Equipment used: None             General transfer comment: Pt able to rise confidently  w/o assist, w/o AD, no LOBs or unsteadiness  Ambulation/Gait Ambulation/Gait assistance: Supervision Ambulation Distance (Feet): 200 Feet Assistive device: None       General Gait Details: Pt able to walk with good speed, consistent cadence and no LOBs/safety issues  Stairs            Wheelchair Mobility    Modified Rankin (Stroke Patients Only)       Balance Overall balance assessment: Modified Independent                                           Pertinent Vitals/Pain Pain Assessment: 0-10 Pain Score: 3  Pain Location: Pt with minimal resting pain, very guarded and reporting increased pain with PROM at shoulder    Home Living Family/patient expects to be discharged to:: Private residence Living Arrangements: Spouse/significant other;Children Available Help at Discharge: Family(mother is going to be here to assist initially)   Home Access: Stairs to enter Entrance Stairs-Rails: None Entrance Stairs-Number of Steps: 3          Prior Function Level of Independence: Independent         Comments: 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 guarded)    Lower Extremity Assessment Lower Extremity Assessment: Overall WFL for tasks assessed  Communication   Communication: No difficulties  Cognition Arousal/Alertness: Awake/alert Behavior During Therapy: WFL for tasks assessed/performed;Anxious Overall Cognitive Status: Within Functional Limits for tasks assessed                                        General Comments      Exercises General Exercises - Upper Extremity Shoulder Flexion: PROM;5 reps;Right(unable to tolerate much, very guarded/anxious to ~40*) Shoulder ABduction: PROM;5 reps(to ~60*) Elbow Flexion: AAROM;5  reps Elbow Extension: AROM;Strengthening;5 reps Wrist Flexion: (issued HEP with instructions to perform BID) Wrist Extension: (issued HEP with instructions to perform BID) Digit Composite Flexion: (issued HEP with instructions to perform BID)   Assessment/Plan    PT Assessment Patient needs continued PT services  PT Problem List Decreased strength;Decreased range of motion;Decreased activity tolerance;Decreased mobility;Decreased coordination;Decreased safety awareness;Decreased knowledge of use of DME;Decreased knowledge of precautions;Pain       PT Treatment Interventions DME instruction;Gait training;Stair training;Functional mobility training;Therapeutic activities;Therapeutic exercise;Balance training;Neuromuscular re-education;Patient/family education;Manual techniques    PT Goals (Current goals can be found in the Care Plan section)  Acute Rehab PT Goals Patient Stated Goal: go home PT Goal Formulation: With patient Time For Goal Achievement: 03/23/17 Potential to Achieve Goals: Good    Frequency BID   Barriers to discharge        Co-evaluation               AM-PAC PT "6 Clicks" Daily Activity  Outcome Measure Difficulty turning over in bed (including adjusting bedclothes, sheets and blankets)?: None Difficulty moving from lying on back to sitting on the side of the bed? : A Little Difficulty sitting down on and standing up from a chair with arms (e.g., wheelchair, bedside commode, etc,.)?: None Help needed moving to and from a bed to chair (including a wheelchair)?: None Help needed walking in hospital room?: None Help needed climbing 3-5 steps with a railing? : A Little 6 Click Score: 22    End of Session Equipment Utilized During Treatment: Gait belt Activity Tolerance: Patient tolerated treatment well Patient left: with chair alarm set;with call bell/phone within reach;with family/visitor present Nurse Communication: Mobility status PT Visit Diagnosis:  Muscle weakness (generalized) (M62.81);Pain Pain - Right/Left: Right Pain - part of body: Shoulder    Time: 9892-1194 PT Time Calculation (min) (ACUTE ONLY): 40 min   Charges:   PT Evaluation $PT Eval Low Complexity: 1 Low PT Treatments $Gait Training: 8-22 mins $Therapeutic Exercise: 8-22 mins   PT G Codes:        Kreg Shropshire, DPT 03/09/2017, 11:09 AM

## 2017-03-09 NOTE — Progress Notes (Signed)
Chaplain responded to a OR for AD. Chaplain educated husband and Pt about POA.    03/09/17 1000  Clinical Encounter Type  Visited With Patient and family together  Visit Type Initial  Referral From Nurse  Spiritual Encounters  Spiritual Needs Brochure  Will be available when they are ready to sign.

## 2017-03-09 NOTE — Discharge Summary (Signed)
Physician Discharge Summary  Subjective: 1 Day Post-Op Procedure(s) (LRB): OPEN REDUCTION INTERNAL FIXATION (ORIF) PROXIMAL HUMERUS FRACTURE (Right) Patient reports pain as severe.   Patient seen in rounds with Dr. Posey Pronto. Patient is well, and has had no acute complaints or problems Patient is ready to go home after physical therapy and better pain control.  Physician Discharge Summary  Patient ID: Maureen Anderson MRN: [id number] DOB/AGE: Jan 26, 1961 57 y.o.  Admit date: 03/08/2017 Discharge date: 03/09/2017  Admission Diagnoses:  Discharge Diagnoses:  Active Problems:   Status post shoulder replacement   Discharged Condition: fair  Hospital Course: The patient is postop day 1 from a right proximal humerus ORIF done by Dr. Posey Pronto. The patient had hypotension on the night after surgery. After several IV boluses her blood pressure became more stable. Her block wore off in the morning of postop day 1 and she began receiving oral pain medication. She is ready to go home after physical therapy.  Treatments: surgery:  1. ORIF R proximal humerus 2. Right open biceps tenodesis  SURGEON: Cato Mulligan, MD  ANESTHESIA: Gen + interscalene block  ESTIMATED BLOOD LOSS: 200cc  TOTAL IV FLUIDS: see anesthesia record  IMPLANTS: [employer] AxSOS 3 hole proximal humerus locking plate with associated locking and cortical screws in the humeral shaft and locking screws in the humeral head    Discharge Exam: Blood pressure (!) 107/57, pulse 66, temperature 97.9 F (36.6 C), temperature source Oral, resp. rate 16, height 5\' 8"  (1.727 m), weight 63.5 kg (140 lb), SpO2 99 %.   Disposition: 01-Home or Self Care   Allergies as of 03/09/2017   No Known Allergies     Medication List    STOP taking these medications   oxyCODONE-acetaminophen 5-325 MG tablet Commonly known as:  PERCOCET     TAKE these medications   acetaminophen 500 MG tablet Commonly known as:  TYLENOL Take 1,000 mg  by mouth every 6 (six) hours as needed for mild pain.   aspirin 325 MG EC tablet Take 1 tablet (325 mg total) by mouth daily.   ibuprofen 200 MG tablet Commonly known as:  ADVIL,MOTRIN Take 400 mg by mouth every 6 (six) hours as needed for mild pain.   loteprednol 0.5 % ophthalmic suspension Commonly known as:  LOTEMAX Place 2 drops into the right eye daily.   multivitamin with minerals Tabs tablet Take 1 tablet by mouth 3 (three) times a week.   naproxen sodium 220 MG tablet Commonly known as:  ALEVE Take 2 tablets by mouth daily as needed (pain).   ondansetron 4 MG tablet Commonly known as:  ZOFRAN Take 1 tablet (4 mg total) by mouth every 6 (six) hours as needed for nausea.   oxyCODONE 5 MG immediate release tablet Commonly known as:  Oxy IR/ROXICODONE Take 1 tablet (5 mg total) by mouth every 3 (three) hours as needed for moderate pain ((score 4 to 6)).      Follow-up Information    Leim Fabry, MD. Go in 2 week(s).   Specialty:  Orthopedic Surgery Why:  For staple removal Contact information: [address] [place] [address] [phone]           Signed: Prescott Parma, Ac Colan 03/09/2017, 7:32 AM   Objective: Vital signs in last 24 hours: Temp:  [97.7 F (36.5 C)-98.7 F (37.1 C)] 97.9 F (36.6 C) (02/12 0020) Pulse Rate:  [63-98] 66 (02/12 0601) Resp:  [10-20] 16 (02/11 1929) BP: (82-115)/(48-76) 107/57 (02/12 0601) SpO2:  [95 %-  100 %] 99 % (02/12 0311) Weight:  [63.5 kg (140 lb)] 63.5 kg (140 lb) (02/11 0820)  Intake/Output from previous day:  Intake/Output Summary (Last 24 hours) at 03/09/2017 0732 Last data filed at 03/09/2017 0546 Gross per 24 hour  Intake 2905 ml  Output 2050 ml  Net 855 ml    Intake/Output this shift: No intake/output data recorded.  Labs: Recent Labs    03/09/17 0640  HGB 10.2*   Recent Labs    03/09/17 0640  WBC 8.1  RBC 3.30*  HCT 29.8*  PLT 297   Recent Labs    03/09/17 0640  NA 138  K 3.8  CL  106  CO2 26  BUN 11  CREATININE 0.74  GLUCOSE 95  CALCIUM 8.6*   No results for input(s): LABPT, INR in the last 72 hours.  EXAM: General - Patient is Alert and Oriented Extremity - Sensation intact distally Intact pulses distally Compartment soft Incision - clean, dry, no drainage, Hemovac removed Motor Function -  dorsiflexion and plantar flexion of her fingers and wrist are intact.  Assessment/Plan: 1 Day Post-Op Procedure(s) (LRB): OPEN REDUCTION INTERNAL FIXATION (ORIF) PROXIMAL HUMERUS FRACTURE (Right) Procedure(s) (LRB): OPEN REDUCTION INTERNAL FIXATION (ORIF) PROXIMAL HUMERUS FRACTURE (Right) Past Medical History:  Diagnosis Date  . Melanoma ([place]) 2014  . Urinary tract infection    Active Problems:   Status post shoulder replacement  Estimated body mass index is 21.29 kg/m as calculated from the following:   Height as of this encounter: 5\' 8"  (1.727 m).   Weight as of this encounter: 63.5 kg (140 lb). Advance diet Up with therapy D/C IV fluids  Discharge home. Diet - Regular diet Follow up - in 2 weeks Activity - WBAT but stay in shoulder immobilizer Disposition - Home Condition Upon Discharge - Stable DVT Prophylaxis - Aspirin  Reche Dixon, PA-C Orthopaedic Surgery 03/09/2017, 7:32 AM

## 2017-03-09 NOTE — Care Management (Signed)
Husband called this RNCM stating that PT is recommending HHPT however I do not see an order for HHPT. No pager number in system; RN will have MD paged to discuss.

## 2017-03-09 NOTE — Progress Notes (Signed)
  Subjective: 1 Day Post-Op Procedure(s) (LRB): OPEN REDUCTION INTERNAL FIXATION (ORIF) PROXIMAL HUMERUS FRACTURE (Right) Patient reports pain as severe.   Patient seen in rounds with Dr. Posey Pronto. Patient is well, and has had no acute complaints or problems Plan is to go Home after hospital stay. Negative for chest pain and shortness of breath Fever: no Gastrointestinal: Negative for nausea and vomiting  Objective: Vital signs in last 24 hours: Temp:  [97.7 F (36.5 C)-98.7 F (37.1 C)] 97.9 F (36.6 C) (02/12 0020) Pulse Rate:  [63-98] 66 (02/12 0601) Resp:  [10-20] 16 (02/11 1929) BP: (82-115)/(48-76) 107/57 (02/12 0601) SpO2:  [95 %-100 %] 99 % (02/12 0311) Weight:  [63.5 kg (140 lb)] 63.5 kg (140 lb) (02/11 0820)  Intake/Output from previous day:  Intake/Output Summary (Last 24 hours) at 03/09/2017 0721 Last data filed at 03/09/2017 0546 Gross per 24 hour  Intake 2905 ml  Output 2050 ml  Net 855 ml    Intake/Output this shift: No intake/output data recorded.  Labs: Recent Labs    03/09/17 0640  HGB 10.2*   Recent Labs    03/09/17 0640  WBC 8.1  RBC 3.30*  HCT 29.8*  PLT 297   No results for input(s): NA, K, CL, CO2, BUN, CREATININE, GLUCOSE, CALCIUM in the last 72 hours. No results for input(s): LABPT, INR in the last 72 hours.   EXAM General - Patient is Alert and Oriented Extremity - Neurovascular intact Sensation intact distally No cellulitis present Dressing/Incision - clean, dry, no drainage, with the Hemovac removed Motor Function - intact, moving fingers and wrist well on exam.   Past Medical History:  Diagnosis Date  . Melanoma ([place]) 2014  . Urinary tract infection     Assessment/Plan: 1 Day Post-Op Procedure(s) (LRB): OPEN REDUCTION INTERNAL FIXATION (ORIF) PROXIMAL HUMERUS FRACTURE (Right) Active Problems:   Status post shoulder replacement  Estimated body mass index is 21.29 kg/m as calculated from the following:   Height as of  this encounter: 5\' 8"  (1.727 m).   Weight as of this encounter: 63.5 kg (140 lb). Advance diet Up with therapy D/C IV fluids   Pain management. Discharge planning home today after physical therapy.  DVT Prophylaxis - Aspirin  shoulder immobilizer on right shoulder.  Reche Dixon, PA-C Orthopaedic Surgery 03/09/2017, 7:21 AM

## 2017-03-09 NOTE — Evaluation (Addendum)
Occupational Therapy Evaluation Patient Details Name: Maureen Anderson MRN: [id number] DOB: 14-Nov-1960 Today's Date: 03/09/2017    History of Present Illness Pt s/p R shoulder fx 2 weeks ago, had been in immobilizer.  Ultimately needed proximal humerus ORIF with bicep tenodesis, POD#1 for OT and PT evaluations.   Clinical Impression   Patient was evaluated this date by OT, she lives at home with her spouse and children. 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 Patient has orders for RUE immobilized and will be NWBing per MD. Patient presents with decreased strength, decreased ability to perform self care tasks. Pt instructed in hemi techniques for dressing, bathing, grooming, positioning/wear schedule/mgt of polar care and shoulder sling/immobilizer, AROM/PROM exercises for the shoulder per Dr. Serita Grit shoulder ORIF protocol with bicep tenodesis. Shoulder discharge instruction sheet provided with all education/training included. Pt verbalized understanding of all education/training provided this date. Patient will benefit from skilled OT care while in the hospital to address these limitations and improve independence in daily tasks. Patient safe to d/c home. Recommend follow up therapy as arranged by the surgeon.     Follow Up Recommendations  No OT follow up;DC plan and follow up therapy as arranged by surgeon    Equipment Recommendations  None recommended by OT    Recommendations for Other Services       Precautions / Restrictions Precautions Precautions: Shoulder Type of Shoulder Precautions: PROM shoulder FF 0-130, PROM ER 0-30, NO AROM shoulder and NO AROM of elbow flexion/supination 2:2 to bicep tenodesis performed Shoulder Interventions: Shoulder sling/immobilizer;At all times;Off for dressing/bathing/exercises Precaution Booklet Issued:  Yes (comment) Precaution Comments: OT shoulder discharge sheet provided Restrictions Weight Bearing Restrictions: Yes RUE Weight Bearing: Non weight bearing      Mobility Bed Mobility              General bed mobility comments: deferred, up in recliner; instructed in body positioning for sleeping in flat bed vs recliner  Transfers Overall transfer level: Modified independent Equipment used: None             General transfer comment: Pt able to rise confidently w/o assist, w/o AD, no LOBs or unsteadiness    Balance Overall balance assessment: Modified Independent                                         ADL either performed or assessed with clinical judgement   ADL Overall ADL's : Needs assistance/impaired Eating/Feeding: Sitting;Modified independent Eating/Feeding Details (indicate cue type and reason): using L hand Grooming: Modified independent Grooming Details (indicate cue type and reason): using LUE; instructed in underarm deodorant while maintaining shoulder precautions Upper Body Bathing: Sitting;Modified independent Upper Body Bathing Details (indicate cue type and reason): instructed in underarm washing while maintaining shoulder precautions Lower Body Bathing: Sitting/lateral leans;Modified independent Lower Body Bathing Details (indicate cue type and reason): using LUE Upper Body Dressing : Minimal assistance Upper Body Dressing Details (indicate cue type and reason): instructed in hemi techniques for UB dressing for tshirt and for button up shirt while maintaining shoulder precautions, pt verbalized understanding, will have assist  Lower Body Dressing: Sit to/from stand;Modified independent   Toilet Transfer: Modified Independent           Functional mobility during ADLs: Supervision/safety  Vision Baseline Vision/History: Wears glasses Wears Glasses: Reading only Patient Visual Report: No change from baseline        Perception     Praxis      Pertinent Vitals/Pain Pain Assessment: 0-10 Pain Score: 1  Pain Location: Pt with minimal resting pain, very guarded and reporting increased pain with PROM at shoulder Pain Intervention(s): Limited activity within patient's tolerance;Monitored during session;Premedicated before session;Ice applied     Hand Dominance Right   Extremity/Trunk Assessment Upper Extremity Assessment Upper Extremity Assessment: RUE deficits/detail(LUE WFL) RUE Deficits / Details: no PROM performed due to guarding and pain, immobilizer and polar care in place at start and end of session RUE: Unable to fully assess due to pain;Unable to fully assess due to immobilization RUE Coordination: decreased gross motor   Lower Extremity Assessment Lower Extremity Assessment: Overall WFL for tasks assessed   Cervical / Trunk Assessment Cervical / Trunk Assessment: Normal   Communication Communication Communication: No difficulties   Cognition Arousal/Alertness: Awake/alert Behavior During Therapy: WFL for tasks assessed/performed Overall Cognitive Status: Within Functional Limits for tasks assessed                                     General Comments  sling/immobilizer and polar care in place at start and end of session with adequate skin protection between skin and polar care    Exercises Other Exercises Other Exercises: Pt instructed in A/PROM exercises to perform, per Dr. Serita Grit shoulder ORIF protocol with handout provided. Other Exercises: Pt instructed in polar care and sling mgt/positioning with demonstration and instruction.    Shoulder Instructions Shoulder Instructions Donning/doffing shirt without moving shoulder: Minimal assistance;Patient able to independently direct caregiver Method for sponge bathing under operated UE: Modified independent Donning/doffing sling/immobilizer: Patient able to independently direct caregiver Correct positioning of  sling/immobilizer: Patient able to independently direct caregiver ROM for elbow, wrist and digits of operated UE: Patient able to independently direct caregiver(no AROM elbow flexion or supination 2:2 to bicep tenodesis) Sling wearing schedule (on at all times/off for ADL's): Patient able to independently direct caregiver Proper positioning of operated UE when showering: Patient able to independently direct caregiver Positioning of UE while sleeping: Patient able to independently direct caregiver    Home Living Family/patient expects to be discharged to:: Private residence Living Arrangements: Spouse/significant other;Children Available Help at Discharge: Family;Available 24 hours/day(mother coming to stay with her to assist initially) Type of Home: House Home Access: Stairs to enter [employer] of Steps: 3 Entrance Stairs-Rails: None Home Layout: One level         Biochemist, clinical: Standard     Home Equipment: None          Prior Functioning/Environment Level of Independence: Independent        Comments: Pt with no limitations at baseline, works, active, Social research officer, 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         OT Problem List: Decreased strength;Decreased range of motion;Impaired UE functional use      OT Treatment/Interventions: Self-care/ADL training;Therapeutic exercise;Therapeutic activities;Patient/family education    OT Goals(Current goals can be found in the care plan section) Acute Rehab OT Goals Patient Stated Goal: go home OT Goal Formulation: With patient Time For Goal Achievement: 03/23/17 Potential to Achieve Goals: Good  OT Frequency: Min 1X/week   Barriers to D/C:            Co-evaluation  AM-PAC PT "6 Clicks" Daily Activity     Outcome Measure Help from another person eating meals?: None Help from another person taking care of personal grooming?: None Help from another person toileting, which includes using toliet, bedpan,  or urinal?: None Help from another person bathing (including washing, rinsing, drying)?: A Little Help from another person to put on and taking off regular upper body clothing?: A Little Help from another person to put on and taking off regular lower body clothing?: None 6 Click Score: 22   End of Session    Activity Tolerance: Patient tolerated treatment well Patient left: in chair;with call bell/phone within reach;with chair alarm set;Other (comment)(polar care and shoulder sling/immobilizer in place)  OT Visit Diagnosis: Pain;Other abnormalities of gait and mobility (R26.89) Pain - Right/Left: Right Pain - part of body: Shoulder                Time: 8638-1771 OT Time Calculation (min): 49 min Charges:  OT General Charges $OT Visit: 1 Visit OT Evaluation $OT Eval Low Complexity: 1 Low OT Treatments $Self Care/Home Management : 23-37 mins  Jeni Salles, MPH, MS, OTR/L ascom [phone] 03/09/17, 12:06 PM

## 2017-03-10 ENCOUNTER — Encounter: Payer: Self-pay | Admitting: Orthopedic Surgery

## 2017-03-12 DIAGNOSIS — M6281 Muscle weakness (generalized): Secondary | ICD-10-CM | POA: Diagnosis not present

## 2017-03-12 DIAGNOSIS — Z9889 Other specified postprocedural states: Secondary | ICD-10-CM | POA: Diagnosis not present

## 2017-03-12 DIAGNOSIS — M25511 Pain in right shoulder: Secondary | ICD-10-CM | POA: Diagnosis not present

## 2017-03-17 DIAGNOSIS — Z9889 Other specified postprocedural states: Secondary | ICD-10-CM | POA: Diagnosis not present

## 2017-03-24 DIAGNOSIS — G8929 Other chronic pain: Secondary | ICD-10-CM | POA: Diagnosis not present

## 2017-03-24 DIAGNOSIS — M25511 Pain in right shoulder: Secondary | ICD-10-CM | POA: Diagnosis not present

## 2017-03-26 DIAGNOSIS — Z9889 Other specified postprocedural states: Secondary | ICD-10-CM | POA: Diagnosis not present

## 2017-04-02 DIAGNOSIS — Z9889 Other specified postprocedural states: Secondary | ICD-10-CM | POA: Diagnosis not present

## 2017-04-08 ENCOUNTER — Encounter: Payer: Self-pay | Admitting: Orthopedic Surgery

## 2017-04-09 DIAGNOSIS — Z9889 Other specified postprocedural states: Secondary | ICD-10-CM | POA: Diagnosis not present

## 2017-04-12 DIAGNOSIS — Z9889 Other specified postprocedural states: Secondary | ICD-10-CM | POA: Diagnosis not present

## 2017-04-14 DIAGNOSIS — Z9889 Other specified postprocedural states: Secondary | ICD-10-CM | POA: Diagnosis not present

## 2017-04-21 DIAGNOSIS — M25511 Pain in right shoulder: Secondary | ICD-10-CM | POA: Diagnosis not present

## 2017-04-22 DIAGNOSIS — M25511 Pain in right shoulder: Secondary | ICD-10-CM | POA: Diagnosis not present

## 2017-04-22 DIAGNOSIS — M6281 Muscle weakness (generalized): Secondary | ICD-10-CM | POA: Diagnosis not present

## 2017-04-22 DIAGNOSIS — Z9889 Other specified postprocedural states: Secondary | ICD-10-CM | POA: Diagnosis not present

## 2017-04-27 DIAGNOSIS — M25511 Pain in right shoulder: Secondary | ICD-10-CM | POA: Diagnosis not present

## 2017-04-27 DIAGNOSIS — M6281 Muscle weakness (generalized): Secondary | ICD-10-CM | POA: Diagnosis not present

## 2017-04-27 DIAGNOSIS — Z9889 Other specified postprocedural states: Secondary | ICD-10-CM | POA: Diagnosis not present

## 2017-04-29 DIAGNOSIS — M25511 Pain in right shoulder: Secondary | ICD-10-CM | POA: Diagnosis not present

## 2017-04-29 DIAGNOSIS — Z9889 Other specified postprocedural states: Secondary | ICD-10-CM | POA: Diagnosis not present

## 2017-04-29 DIAGNOSIS — M6281 Muscle weakness (generalized): Secondary | ICD-10-CM | POA: Diagnosis not present

## 2017-05-04 DIAGNOSIS — M25511 Pain in right shoulder: Secondary | ICD-10-CM | POA: Diagnosis not present

## 2017-05-04 DIAGNOSIS — M6281 Muscle weakness (generalized): Secondary | ICD-10-CM | POA: Diagnosis not present

## 2017-05-04 DIAGNOSIS — Z9889 Other specified postprocedural states: Secondary | ICD-10-CM | POA: Diagnosis not present

## 2017-05-05 DIAGNOSIS — M25511 Pain in right shoulder: Secondary | ICD-10-CM | POA: Diagnosis not present

## 2017-05-06 DIAGNOSIS — M6281 Muscle weakness (generalized): Secondary | ICD-10-CM | POA: Diagnosis not present

## 2017-05-06 DIAGNOSIS — Z9889 Other specified postprocedural states: Secondary | ICD-10-CM | POA: Diagnosis not present

## 2017-05-06 DIAGNOSIS — M25511 Pain in right shoulder: Secondary | ICD-10-CM | POA: Diagnosis not present

## 2017-05-11 DIAGNOSIS — M25511 Pain in right shoulder: Secondary | ICD-10-CM | POA: Diagnosis not present

## 2017-05-11 DIAGNOSIS — Z9889 Other specified postprocedural states: Secondary | ICD-10-CM | POA: Diagnosis not present

## 2017-05-11 DIAGNOSIS — M6281 Muscle weakness (generalized): Secondary | ICD-10-CM | POA: Diagnosis not present

## 2017-05-13 DIAGNOSIS — M6281 Muscle weakness (generalized): Secondary | ICD-10-CM | POA: Diagnosis not present

## 2017-05-13 DIAGNOSIS — M25511 Pain in right shoulder: Secondary | ICD-10-CM | POA: Diagnosis not present

## 2017-05-13 DIAGNOSIS — Z9889 Other specified postprocedural states: Secondary | ICD-10-CM | POA: Diagnosis not present

## 2017-05-18 DIAGNOSIS — M6281 Muscle weakness (generalized): Secondary | ICD-10-CM | POA: Diagnosis not present

## 2017-05-18 DIAGNOSIS — Z9889 Other specified postprocedural states: Secondary | ICD-10-CM | POA: Diagnosis not present

## 2017-05-18 DIAGNOSIS — M25511 Pain in right shoulder: Secondary | ICD-10-CM | POA: Diagnosis not present

## 2017-05-20 DIAGNOSIS — Z9889 Other specified postprocedural states: Secondary | ICD-10-CM | POA: Diagnosis not present

## 2017-05-20 DIAGNOSIS — M6281 Muscle weakness (generalized): Secondary | ICD-10-CM | POA: Diagnosis not present

## 2017-05-20 DIAGNOSIS — M25511 Pain in right shoulder: Secondary | ICD-10-CM | POA: Diagnosis not present

## 2017-05-25 DIAGNOSIS — Z9889 Other specified postprocedural states: Secondary | ICD-10-CM | POA: Diagnosis not present

## 2017-05-25 DIAGNOSIS — M25511 Pain in right shoulder: Secondary | ICD-10-CM | POA: Diagnosis not present

## 2017-05-25 DIAGNOSIS — M6281 Muscle weakness (generalized): Secondary | ICD-10-CM | POA: Diagnosis not present

## 2017-05-27 DIAGNOSIS — M6281 Muscle weakness (generalized): Secondary | ICD-10-CM | POA: Diagnosis not present

## 2017-05-27 DIAGNOSIS — M25511 Pain in right shoulder: Secondary | ICD-10-CM | POA: Diagnosis not present

## 2017-05-27 DIAGNOSIS — Z9889 Other specified postprocedural states: Secondary | ICD-10-CM | POA: Diagnosis not present

## 2017-06-01 DIAGNOSIS — M25511 Pain in right shoulder: Secondary | ICD-10-CM | POA: Diagnosis not present

## 2017-06-01 DIAGNOSIS — Z9889 Other specified postprocedural states: Secondary | ICD-10-CM | POA: Diagnosis not present

## 2017-06-01 DIAGNOSIS — M25611 Stiffness of right shoulder, not elsewhere classified: Secondary | ICD-10-CM | POA: Diagnosis not present

## 2017-06-03 DIAGNOSIS — M6281 Muscle weakness (generalized): Secondary | ICD-10-CM | POA: Diagnosis not present

## 2017-06-03 DIAGNOSIS — M25511 Pain in right shoulder: Secondary | ICD-10-CM | POA: Diagnosis not present

## 2017-06-03 DIAGNOSIS — Z9889 Other specified postprocedural states: Secondary | ICD-10-CM | POA: Diagnosis not present

## 2017-06-09 DIAGNOSIS — M25511 Pain in right shoulder: Secondary | ICD-10-CM | POA: Diagnosis not present

## 2017-06-11 DIAGNOSIS — H5213 Myopia, bilateral: Secondary | ICD-10-CM | POA: Diagnosis not present

## 2017-07-15 DIAGNOSIS — M25511 Pain in right shoulder: Secondary | ICD-10-CM | POA: Diagnosis not present

## 2017-09-22 DIAGNOSIS — Z85820 Personal history of malignant melanoma of skin: Secondary | ICD-10-CM | POA: Diagnosis not present

## 2017-09-22 DIAGNOSIS — D2271 Melanocytic nevi of right lower limb, including hip: Secondary | ICD-10-CM | POA: Diagnosis not present

## 2017-09-22 DIAGNOSIS — Z85828 Personal history of other malignant neoplasm of skin: Secondary | ICD-10-CM | POA: Diagnosis not present

## 2017-10-01 ENCOUNTER — Telehealth: Payer: Self-pay | Admitting: Obstetrics and Gynecology

## 2017-10-01 DIAGNOSIS — Z13220 Encounter for screening for lipoid disorders: Secondary | ICD-10-CM

## 2017-10-01 DIAGNOSIS — Z0000 Encounter for general adult medical examination without abnormal findings: Secondary | ICD-10-CM

## 2017-10-01 NOTE — Telephone Encounter (Signed)
Patient is calling to wanting to schedule for her annual labs first then to schedule for her annual exam with Elmo Putt. Please order labs I will call to schedule patient for appointments. Thank you!

## 2017-10-04 NOTE — Telephone Encounter (Signed)
Pls call pt to sched appts. Checking full metabolic panel and lipids. Does she want anything else?

## 2017-10-04 NOTE — Telephone Encounter (Signed)
Orders placed.

## 2017-10-04 NOTE — Telephone Encounter (Signed)
Patient is wanting to have her Bone densitiy also. Please advise.l

## 2017-10-07 ENCOUNTER — Other Ambulatory Visit: Payer: 59

## 2017-10-07 DIAGNOSIS — Z0000 Encounter for general adult medical examination without abnormal findings: Secondary | ICD-10-CM | POA: Diagnosis not present

## 2017-10-07 DIAGNOSIS — Z13220 Encounter for screening for lipoid disorders: Secondary | ICD-10-CM

## 2017-10-08 LAB — LIPID PANEL
CHOLESTEROL TOTAL: 214 mg/dL — AB (ref 100–199)
Chol/HDL Ratio: 2.4 ratio (ref 0.0–4.4)
HDL: 91 mg/dL (ref >39)
LDL CALC: 109 mg/dL — AB (ref 0–99)
TRIGLYCERIDES: 70 mg/dL (ref 0–149)
VLDL CHOLESTEROL CAL: 14 mg/dL (ref 5–40)

## 2017-10-08 LAB — COMPREHENSIVE METABOLIC PANEL
ALBUMIN: 4.5 g/dL (ref 3.5–5.5)
ALT: 9 IU/L (ref 0–32)
AST: 20 IU/L (ref 0–40)
Albumin/Globulin Ratio: 1.8 (ref 1.2–2.2)
Alkaline Phosphatase: 55 IU/L (ref 39–117)
BUN/Creatinine Ratio: 14 (ref 9–23)
BUN: 12 mg/dL (ref 6–24)
Bilirubin Total: 0.8 mg/dL (ref 0.0–1.2)
CHLORIDE: 104 mmol/L (ref 96–106)
CO2: 26 mmol/L (ref 20–29)
CREATININE: 0.87 mg/dL (ref 0.57–1.00)
Calcium: 10 mg/dL (ref 8.7–10.2)
GFR calc Af Amer: 87 mL/min/{1.73_m2} (ref >59)
GFR calc non Af Amer: 76 mL/min/{1.73_m2} (ref >59)
GLUCOSE: 85 mg/dL (ref 65–99)
Globulin, Total: 2.5 g/dL (ref 1.5–4.5)
Potassium: 5.2 mmol/L (ref 3.5–5.2)
Sodium: 144 mmol/L (ref 134–144)
Total Protein: 7 g/dL (ref 6.0–8.5)

## 2017-10-13 ENCOUNTER — Encounter: Payer: Self-pay | Admitting: Obstetrics and Gynecology

## 2017-10-13 ENCOUNTER — Ambulatory Visit (INDEPENDENT_AMBULATORY_CARE_PROVIDER_SITE_OTHER): Payer: 59 | Admitting: Obstetrics and Gynecology

## 2017-10-13 ENCOUNTER — Other Ambulatory Visit (HOSPITAL_COMMUNITY)
Admission: RE | Admit: 2017-10-13 | Discharge: 2017-10-13 | Disposition: A | Discharge status: Home / Self Care | Payer: 59 | Source: Ambulatory Visit | Attending: Obstetrics and Gynecology | Admitting: Obstetrics and Gynecology

## 2017-10-13 VITALS — BP 110/60 | HR 66 | Ht 68.0 in | Wt 140.0 lb

## 2017-10-13 DIAGNOSIS — Z124 Encounter for screening for malignant neoplasm of cervix: Secondary | ICD-10-CM | POA: Insufficient documentation

## 2017-10-13 DIAGNOSIS — Z1239 Encounter for other screening for malignant neoplasm of breast: Secondary | ICD-10-CM

## 2017-10-13 DIAGNOSIS — Z1231 Encounter for screening mammogram for malignant neoplasm of breast: Secondary | ICD-10-CM | POA: Diagnosis not present

## 2017-10-13 DIAGNOSIS — Z01419 Encounter for gynecological examination (general) (routine) without abnormal findings: Secondary | ICD-10-CM

## 2017-10-13 NOTE — Progress Notes (Signed)
Chief Complaint  Patient presents with  . Gynecologic Exam    HPI:      [name] is a 57 y.o. No obstetric history on file. who LMP was No LMP recorded. Patient is postmenopausal., presents today for her annual examination.  Her menses are absent and she is postmenopausal. She does not have intermenstrual bleeding.  She does not have vasomotor sx.  Sex activity: single partner, contraception - post menopausal status. She does not have vaginal dryness.  Last Pap: 07/20/16  Results were: no abnormalities/neg HPV DNA . Pt likes yearly paps.   Last mammogram: May 22, 2015  Results were: normal--routine follow-up in 12 months There is a FH of breast cancer in her [place], genetic testing not idicated. There is no FH of ovarian cancer. The patient does do self-breast exams.  Colonoscopy: colonoscopy 3 years ago without abnormalities. Repeat due after 10 yrs.  Tobacco use: The patient denies current or previous tobacco use. Alcohol use: none Exercise: moderately active  She does get adequate calcium and Vitamin D in her diet. She had normal fasting labs 2019. Total chol slightly elevated at 214 but HDL=91 and LDL=109.  Past Medical History:  Diagnosis Date  . Melanoma ([place]) 2014  . Urinary tract infection     Past Surgical History:  Procedure Laterality Date  . CESAREAN SECTION     x2  . COLONOSCOPY  09/2015   normal   . MELANOMA EXCISION    . ORIF HUMERUS FRACTURE Right 03/08/2017   Procedure: OPEN REDUCTION INTERNAL FIXATION (ORIF) PROXIMAL HUMERUS FRACTURE;  Surgeon: Leim Fabry, MD;  Location: [facility] ORS;  Service: Orthopedics;  Laterality: Right;    Family History  Problem Relation Age of Onset  . Pancreatic cancer Maternal Grandmother 80  . Colon cancer Maternal Grandfather 62  . Breast cancer Other 39    Social History   Socioeconomic History  . Marital status: Married    Spouse name: Not on file  . Number of children: Not on file  . Years of  education: Not on file  . Highest education level: Not on file  Occupational History  . Not on file  Social Needs  . Financial resource strain: Not on file  . Food insecurity:    Worry: Not on file    Inability: Not on file  . Transportation needs:    Medical: Not on file    Non-medical: Not on file  Tobacco Use  . Smoking status: Never Smoker  . Smokeless tobacco: Never Used  Substance and Sexual Activity  . Alcohol use: No  . Drug use: No  . Sexual activity: Yes    Birth control/protection: Post-menopausal  Lifestyle  . Physical activity:    Days per week: Not on file    Minutes per session: Not on file  . Stress: Not on file  Relationships  . Social connections:    Talks on phone: Not on file    Gets together: Not on file    Attends religious service: Not on file    Active member of club or organization: Not on file    Attends meetings of clubs or organizations: Not on file    Relationship status: Not on file  . Intimate partner violence:    Fear of current or ex partner: Not on file    Emotionally abused: Not on file    Physically abused: Not on file    Forced sexual activity: Not on file  Other Topics  Concern  . Not on file  Social History Narrative  . Not on file     Current Outpatient Medications:  .  loteprednol (LOTEMAX) 0.5 % ophthalmic suspension, Place 2 drops into the right eye daily., Disp: , Rfl:  .  Multiple Vitamin (MULTIVITAMIN WITH MINERALS) TABS tablet, Take 1 tablet by mouth 3 (three) times a week., Disp: , Rfl:    ROS:  Review of Systems  Constitutional: Negative for fatigue, fever and unexpected weight change.  Respiratory: Negative for cough, shortness of breath and wheezing.   Cardiovascular: Negative for chest pain, palpitations and leg swelling.  Gastrointestinal: Negative for blood in stool, constipation, diarrhea, nausea and vomiting.  Endocrine: Negative for cold intolerance, heat intolerance and polyuria.  Genitourinary:  Negative for dyspareunia, dysuria, flank pain, frequency, genital sores, hematuria, menstrual problem, pelvic pain, urgency, vaginal bleeding, vaginal discharge and vaginal pain.  Musculoskeletal: Negative for back pain, joint swelling and myalgias.  Skin: Negative for rash.  Neurological: Negative for dizziness, syncope, light-headedness, numbness and headaches.  Hematological: Negative for adenopathy.  Psychiatric/Behavioral: Negative for agitation, confusion, sleep disturbance and suicidal ideas. The patient is not nervous/anxious.      Objective: BP 110/60   Pulse 66   Ht 5\' 8"  (1.727 m)   Wt 140 lb (63.5 kg)   BMI 21.29 kg/m    Physical Exam  Constitutional: She is oriented to person, place, and time. She appears well-developed and well-nourished.  Genitourinary: Vagina normal and uterus normal. There is no rash or tenderness on the right labia. There is no rash or tenderness on the left labia. No erythema or tenderness in the vagina. No vaginal discharge found. Right adnexum does not display mass and does not display tenderness. Left adnexum does not display mass and does not display tenderness. Cervix does not exhibit motion tenderness or polyp. Uterus is not enlarged or tender.  Neck: Normal range of motion. No thyromegaly present.  Cardiovascular: Normal rate, regular rhythm and normal heart sounds.  No murmur heard. Pulmonary/Chest: Effort normal and breath sounds normal. Right breast exhibits no mass, no nipple discharge, no skin change and no tenderness. Left breast exhibits no mass, no nipple discharge, no skin change and no tenderness.  Abdominal: Soft. There is no tenderness. There is no guarding.  Musculoskeletal: Normal range of motion.  Neurological: She is alert and oriented to person, place, and time. No cranial nerve deficit.  Psychiatric: She has a normal mood and affect. Her behavior is normal.  Vitals reviewed.   Assessment/Plan:  Encounter for annual routine  gynecological examination  Cervical cancer screening - Plan: Cytology - PAP  Screening for breast cancer - Pt to sched mammo - Plan: MM 3D SCREEN BREAST BILATERAL         GYN counsel mammography screening, adequate intake of calcium and vitamin D, diet and exercise     F/U  Return in about 1 year (around 10/14/2018).  Alicia B. Copland, PA-C 10/13/2017 2:37 PM

## 2017-10-13 NOTE — Patient Instructions (Signed)
I value your feedback and entrusting us with your care. If you get a [place] patient survey, I would appreciate you taking the time to let us know about your experience today. Thank you! 

## 2017-10-15 LAB — CYTOLOGY - PAP: DIAGNOSIS: NEGATIVE

## 2018-09-26 ENCOUNTER — Telehealth: Payer: Self-pay

## 2018-09-26 DIAGNOSIS — Z131 Encounter for screening for diabetes mellitus: Secondary | ICD-10-CM

## 2018-09-26 DIAGNOSIS — Z0000 Encounter for general adult medical examination without abnormal findings: Secondary | ICD-10-CM

## 2018-09-26 DIAGNOSIS — Z13220 Encounter for screening for lipoid disorders: Secondary | ICD-10-CM

## 2018-09-26 NOTE — Telephone Encounter (Signed)
Pt says she does not know if she's due for colonoscopy? She is getting info from her mychart. When I asked her what labs she was looking for she said "I gotta go, I am on the other line, I will call back".

## 2018-09-26 NOTE — Telephone Encounter (Signed)
Pt had colonoscopy 8/17 at [facility]. Is she due again in 3 yrs? Also, what labs is she specifically looking for. Has been getting CBC, CMP and lipid panel, which is standard. But, I can order additional labs if she wants.

## 2018-09-26 NOTE — Telephone Encounter (Signed)
Patient is scheduled for AE w/[facility]. She has taken care of her Mammogram, but she needs to have a colonoscopy scheduled and she would like to go to where she went last time if [facility] could get that arranged/ordered. She would also like to get her blood work prior to her apt so she could go over her results at her apt. She is requesting a more complete blood panel as she only gets a few numbers compared to what her husband gets. [phone]

## 2018-10-14 NOTE — Addendum Note (Signed)
Addended by: Ardeth Perfect B on: [id number] 02:49 PM   Modules accepted: Orders

## 2018-10-14 NOTE — Telephone Encounter (Signed)
Spoke w/patient. She is fine with the normal labs [facility] orders, but does request a prediabetes lab/test of her blood sugar. Lab Apt scheduled for 9:40 on Monday 10/17/2018

## 2018-10-14 NOTE — Telephone Encounter (Signed)
Lab orders placed.  

## 2018-10-14 NOTE — Telephone Encounter (Signed)
Patient has apt 10/21/2018. Wants lab work done on Monday so results will possibly be back. [phone]

## 2018-10-17 ENCOUNTER — Other Ambulatory Visit: Payer: 59

## 2018-10-17 ENCOUNTER — Other Ambulatory Visit: Payer: Self-pay

## 2018-10-17 DIAGNOSIS — Z131 Encounter for screening for diabetes mellitus: Secondary | ICD-10-CM

## 2018-10-17 DIAGNOSIS — Z13220 Encounter for screening for lipoid disorders: Secondary | ICD-10-CM

## 2018-10-17 DIAGNOSIS — Z0000 Encounter for general adult medical examination without abnormal findings: Secondary | ICD-10-CM

## 2018-10-18 LAB — COMPREHENSIVE METABOLIC PANEL
ALT: 9 IU/L (ref 0–32)
AST: 22 IU/L (ref 0–40)
Albumin/Globulin Ratio: 2 (ref 1.2–2.2)
Albumin: 4.7 g/dL (ref 3.8–4.9)
Alkaline Phosphatase: 65 IU/L (ref 39–117)
BUN/Creatinine Ratio: 11 (ref 9–23)
BUN: 8 mg/dL (ref 6–24)
Bilirubin Total: 0.7 mg/dL (ref 0.0–1.2)
CO2: 25 mmol/L (ref 20–29)
Calcium: 9.7 mg/dL (ref 8.7–10.2)
Chloride: 105 mmol/L (ref 96–106)
Creatinine, Ser: 0.74 mg/dL (ref 0.57–1.00)
GFR calc Af Amer: 105 mL/min/{1.73_m2} (ref >59)
GFR calc non Af Amer: 91 mL/min/{1.73_m2} (ref >59)
Globulin, Total: 2.3 g/dL (ref 1.5–4.5)
Glucose: 80 mg/dL (ref 65–99)
Potassium: 4.5 mmol/L (ref 3.5–5.2)
Sodium: 143 mmol/L (ref 134–144)
Total Protein: 7 g/dL (ref 6.0–8.5)

## 2018-10-18 LAB — LIPID PANEL
Chol/HDL Ratio: 2.2 ratio (ref 0.0–4.4)
Cholesterol, Total: 215 mg/dL — ABNORMAL HIGH (ref 100–199)
HDL: 100 mg/dL (ref >39)
LDL Chol Calc (NIH): 105 mg/dL — ABNORMAL HIGH (ref 0–99)
Triglycerides: 54 mg/dL (ref 0–149)
VLDL Cholesterol Cal: 10 mg/dL (ref 5–40)

## 2018-10-18 LAB — HEMOGLOBIN A1C
Est. average glucose Bld gHb Est-mCnc: 103 mg/dL
Hgb A1c MFr Bld: 5.2 % (ref 4.8–5.6)

## 2018-10-18 NOTE — Progress Notes (Signed)
Pls give pt her lab results. Lipids are borderline. Diet changes with decreased saturated fats in food (red meats, whole fat cheeses/ice cream/butter, egg yolks, fried foods). Rechk in 1 yr. Thx.

## 2018-10-18 NOTE — Progress Notes (Signed)
She needs to activate mychart in order to get them. I sent text to her phone to activate--pls tell her to follow instructions so she can see them. Thx

## 2018-10-18 NOTE — Progress Notes (Signed)
Pt aware.

## 2018-10-25 NOTE — Progress Notes (Deleted)
No chief complaint on file.   HPI:      [name] is a 58 y.o. No obstetric history on file. who LMP was No LMP recorded. Patient is postmenopausal., presents today for her annual examination.  Her menses are absent and she is postmenopausal. She does not have intermenstrual bleeding. She does not have vasomotor sx.   Sex activity: single partner, contraception - post menopausal status. She does not have vaginal dryness.  Last Pap: 10/13/17 Results were: no abnormalities/neg HPV DNA 2018 . Pt likes yearly paps.   Last mammogram: 09/30/18 at [facility]. Results were: normal--routine follow-up in 12 months There is a FH of breast cancer in her [place], genetic testing not idicated. There is no FH of ovarian cancer. The patient does do self-breast exams.  Colonoscopy: colonoscopy 2017  without abnormalities. Repeat due after 10 yrs.  Tobacco use: The patient denies current or previous tobacco use. Alcohol use: none Exercise: moderately active  She does get adequate calcium and Vitamin D in her diet. She had normal fasting labs 2020. Total chol slightly elevated at 215 but HDL=100 and LDL=105.  Past Medical History:  Diagnosis Date  . Melanoma ([place]) 2014  . Urinary tract infection     Past Surgical History:  Procedure Laterality Date  . CESAREAN SECTION     x2  . COLONOSCOPY  09/2015   normal   . MELANOMA EXCISION    . ORIF HUMERUS FRACTURE Right 03/08/2017   Procedure: OPEN REDUCTION INTERNAL FIXATION (ORIF) PROXIMAL HUMERUS FRACTURE;  Surgeon: Leim Fabry, MD;  Location: [facility] ORS;  Service: Orthopedics;  Laterality: Right;    Family History  Problem Relation Age of Onset  . Pancreatic cancer Maternal Grandmother 80  . Colon cancer Maternal Grandfather 14  . Breast cancer Other 35    Social History   Socioeconomic History  . Marital status: Married    Spouse name: Not on file  . Number of children: Not on file  . Years of education: Not on file  . Highest education  level: Not on file  Occupational History  . Not on file  Social Needs  . Financial resource strain: Not on file  . Food insecurity    Worry: Not on file    Inability: Not on file  . Transportation needs    Medical: Not on file    Non-medical: Not on file  Tobacco Use  . Smoking status: Never Smoker  . Smokeless tobacco: Never Used  Substance and Sexual Activity  . Alcohol use: No  . Drug use: No  . Sexual activity: Yes    Birth control/protection: Post-menopausal  Lifestyle  . Physical activity    Days per week: Not on file    Minutes per session: Not on file  . Stress: Not on file  Relationships  . Social Herbalist on phone: Not on file    Gets together: Not on file    Attends religious service: Not on file    Active member of club or organization: Not on file    Attends meetings of clubs or organizations: Not on file    Relationship status: Not on file  . Intimate partner violence    Fear of current or ex partner: Not on file    Emotionally abused: Not on file    Physically abused: Not on file    Forced sexual activity: Not on file  Other Topics Concern  . Not on file  Social  History Narrative  . Not on file     Current Outpatient Medications:  .  loteprednol (LOTEMAX) 0.5 % ophthalmic suspension, Place 2 drops into the right eye daily., Disp: , Rfl:  .  Multiple Vitamin (MULTIVITAMIN WITH MINERALS) TABS tablet, Take 1 tablet by mouth 3 (three) times a week., Disp: , Rfl:    ROS:  Review of Systems  Constitutional: Negative for fatigue, fever and unexpected weight change.  Respiratory: Negative for cough, shortness of breath and wheezing.   Cardiovascular: Negative for chest pain, palpitations and leg swelling.  Gastrointestinal: Negative for blood in stool, constipation, diarrhea, nausea and vomiting.  Endocrine: Negative for cold intolerance, heat intolerance and polyuria.  Genitourinary: Negative for dyspareunia, dysuria, flank pain, frequency,  genital sores, hematuria, menstrual problem, pelvic pain, urgency, vaginal bleeding, vaginal discharge and vaginal pain.  Musculoskeletal: Negative for back pain, joint swelling and myalgias.  Skin: Negative for rash.  Neurological: Negative for dizziness, syncope, light-headedness, numbness and headaches.  Hematological: Negative for adenopathy.  Psychiatric/Behavioral: Negative for agitation, confusion, sleep disturbance and suicidal ideas. The patient is not nervous/anxious.      Objective: There were no vitals taken for this visit.   Physical Exam Constitutional:      Appearance: She is well-developed.  Genitourinary:     Vagina and uterus normal.     No vaginal discharge, erythema or tenderness.     No cervical motion tenderness or polyp.     Uterus is not enlarged or tender.     No right or left adnexal mass present.     Right adnexa not tender.     Left adnexa not tender.  Neck:     Musculoskeletal: Normal range of motion.     Thyroid: No thyromegaly.  Cardiovascular:     Rate and Rhythm: Normal rate and regular rhythm.     Heart sounds: Normal heart sounds. No murmur.  Pulmonary:     Effort: Pulmonary effort is normal.     Breath sounds: Normal breath sounds.  Chest:     Breasts:        Right: No mass, nipple discharge, skin change or tenderness.        Left: No mass, nipple discharge, skin change or tenderness.  Abdominal:     Palpations: Abdomen is soft.     Tenderness: There is no abdominal tenderness. There is no guarding.  Musculoskeletal: Normal range of motion.  Neurological:     Mental Status: She is alert and oriented to person, place, and time.     Cranial Nerves: No cranial nerve deficit.  Psychiatric:        Behavior: Behavior normal.  Vitals signs reviewed.     Assessment/Plan:  No diagnosis found.         GYN counsel mammography screening, adequate intake of calcium and vitamin D, diet and exercise     F/U  No follow-ups on file.   Yigit Norkus B. Anjalina Bergevin, PA-C 10/25/2018 4:29 PM

## 2018-10-26 ENCOUNTER — Ambulatory Visit: Payer: 59 | Admitting: Obstetrics and Gynecology

## 2018-11-02 NOTE — Progress Notes (Signed)
Chief Complaint  Patient presents with  . Gynecologic Exam    HPI:      [name] is a 58 y.o. No obstetric history on file. who LMP was No LMP recorded. Patient is postmenopausal., presents today for her annual examination.  Her menses are absent and she is postmenopausal. She does not have intermenstrual bleeding. She does not have vasomotor sx.   Sex activity: single partner, contraception - post menopausal status. She does not have vaginal dryness.  Last Pap: 10/13/17 Results were: no abnormalities/neg HPV DNA 2018 . Pt likes yearly paps.   Last mammogram: 09/30/18 at [facility]. Results were: normal--routine follow-up in 12 months There is a FH of breast cancer in her [place], genetic testing not idicated. There is a FH of pancreatic cancer in her [place] (daughter of [place] with breast cancer). There is no FH of ovarian cancer. Pt with hx of melanoma. The patient does do self-breast exams.  Colonoscopy: colonoscopy 2017  without abnormalities. Repeat due after 10 yrs.  Tobacco use: The patient denies current or previous tobacco use. Alcohol use: social No drug use Exercise: moderately active  She does get adequate calcium and Vitamin D in her diet. She had normal fasting labs 2020. Total chol slightly elevated at 215 but HDL=100 and LDL=105.  Past Medical History:  Diagnosis Date  . Melanoma ([place]) 2014  . Urinary tract infection     Past Surgical History:  Procedure Laterality Date  . CESAREAN SECTION     x2  . COLONOSCOPY  09/2015   normal   . MELANOMA EXCISION    . ORIF HUMERUS FRACTURE Right 03/08/2017   Procedure: OPEN REDUCTION INTERNAL FIXATION (ORIF) PROXIMAL HUMERUS FRACTURE;  Surgeon: Leim Fabry, MD;  Location: [facility] ORS;  Service: Orthopedics;  Laterality: Right;    Family History  Problem Relation Age of Onset  . Pancreatic cancer Maternal Grandmother 80  . Colon cancer Maternal Grandfather 17  . Breast cancer Other 38       daughter with pancreatic cancer     Social History   Socioeconomic History  . Marital status: Married    Spouse name: Not on file  . Number of children: Not on file  . Years of education: Not on file  . Highest education level: Not on file  Occupational History  . Not on file  Social Needs  . Financial resource strain: Not on file  . Food insecurity    Worry: Not on file    Inability: Not on file  . Transportation needs    Medical: Not on file    Non-medical: Not on file  Tobacco Use  . Smoking status: Never Smoker  . Smokeless tobacco: Never Used  Substance and Sexual Activity  . Alcohol use: No  . Drug use: No  . Sexual activity: Yes    Birth control/protection: Post-menopausal  Lifestyle  . Physical activity    Days per week: Not on file    Minutes per session: Not on file  . Stress: Not on file  Relationships  . Social Herbalist on phone: Not on file    Gets together: Not on file    Attends religious service: Not on file    Active member of club or organization: Not on file    Attends meetings of clubs or organizations: Not on file    Relationship status: Not on file  . Intimate partner violence    Fear of current or ex  partner: Not on file    Emotionally abused: Not on file    Physically abused: Not on file    Forced sexual activity: Not on file  Other Topics Concern  . Not on file  Social History Narrative  . Not on file     Current Outpatient Medications:  .  loteprednol (LOTEMAX) 0.5 % ophthalmic suspension, Place 2 drops into the right eye daily., Disp: , Rfl:  .  Multiple Vitamin (MULTIVITAMIN WITH MINERALS) TABS tablet, Take 1 tablet by mouth 3 (three) times a week., Disp: , Rfl:  .  XIIDRA 5 % SOLN, , Disp: , Rfl:    ROS:  Review of Systems  Constitutional: Negative for fatigue, fever and unexpected weight change.  Respiratory: Negative for cough, shortness of breath and wheezing.   Cardiovascular: Negative for chest pain, palpitations and leg swelling.   Gastrointestinal: Negative for blood in stool, constipation, diarrhea, nausea and vomiting.  Endocrine: Negative for cold intolerance, heat intolerance and polyuria.  Genitourinary: Negative for dyspareunia, dysuria, flank pain, frequency, genital sores, hematuria, menstrual problem, pelvic pain, urgency, vaginal bleeding, vaginal discharge and vaginal pain.  Musculoskeletal: Negative for back pain, joint swelling and myalgias.  Skin: Negative for rash.  Neurological: Negative for dizziness, syncope, light-headedness, numbness and headaches.  Hematological: Negative for adenopathy.  Psychiatric/Behavioral: Negative for agitation, confusion, sleep disturbance and suicidal ideas. The patient is not nervous/anxious.      Objective: BP 110/60   Ht '5\' 8"'$  (1.727 m)   Wt 136 lb (61.7 kg)   BMI 20.68 kg/m    Physical Exam Constitutional:      Appearance: She is well-developed.  Genitourinary:     Vulva, vagina, uterus, right adnexa and left adnexa normal.     No vulval lesion or tenderness noted.     No vaginal discharge, erythema or tenderness.     No cervical motion tenderness or polyp.     Uterus is not enlarged or tender.     No right or left adnexal mass present.     Right adnexa not tender.     Left adnexa not tender.  Neck:     Musculoskeletal: Normal range of motion.     Thyroid: No thyromegaly.  Cardiovascular:     Rate and Rhythm: Normal rate and regular rhythm.     Heart sounds: Normal heart sounds. No murmur.  Pulmonary:     Effort: Pulmonary effort is normal.     Breath sounds: Normal breath sounds.  Chest:     Breasts:        Right: No mass, nipple discharge, skin change or tenderness.        Left: No mass, nipple discharge, skin change or tenderness.  Abdominal:     Palpations: Abdomen is soft.     Tenderness: There is no abdominal tenderness. There is no guarding.  Musculoskeletal: Normal range of motion.  Neurological:     General: No focal deficit present.      Mental Status: She is alert and oriented to person, place, and time.     Cranial Nerves: No cranial nerve deficit.  Skin:    General: Skin is warm and dry.  Psychiatric:        Mood and Affect: Mood normal.        Behavior: Behavior normal.        Thought Content: Thought content normal.        Judgment: Judgment normal.  Vitals signs reviewed.     Assessment/Plan:  Encounter for annual routine gynecological examination  Cervical cancer screening - Plan: Cytology - PAP; pt likes yearly paps  Encounter for screening mammogram for malignant neoplasm of breast--pt current on mammo  Family history of pancreatic cancer--MyRisk testing discussed and done today. Will call with results.          GYN counsel mammography screening, adequate intake of calcium and vitamin D, diet and exercise  Orders Placed This Encounter  Procedures  . Integrated [facility] ([facility])      F/U  Return in about 1 year (around 11/03/2019).  Brindy Higginbotham B. Val Farnam, PA-C 11/03/2018 10:13 AM

## 2018-11-02 NOTE — Patient Instructions (Signed)
I value your feedback and entrusting us with your care. If you get a [place] patient survey, I would appreciate you taking the time to let us know about your experience today. Thank you! 

## 2018-11-03 ENCOUNTER — Other Ambulatory Visit: Payer: Self-pay

## 2018-11-03 ENCOUNTER — Encounter: Payer: Self-pay | Admitting: Obstetrics and Gynecology

## 2018-11-03 ENCOUNTER — Other Ambulatory Visit (HOSPITAL_COMMUNITY)
Admission: RE | Admit: 2018-11-03 | Discharge: 2018-11-03 | Disposition: A | Discharge status: Home / Self Care | Payer: 59 | Source: Ambulatory Visit | Attending: Obstetrics and Gynecology | Admitting: Obstetrics and Gynecology

## 2018-11-03 ENCOUNTER — Ambulatory Visit (INDEPENDENT_AMBULATORY_CARE_PROVIDER_SITE_OTHER): Payer: 59 | Admitting: Obstetrics and Gynecology

## 2018-11-03 VITALS — BP 110/60 | Ht 68.0 in | Wt 136.0 lb

## 2018-11-03 DIAGNOSIS — Z124 Encounter for screening for malignant neoplasm of cervix: Secondary | ICD-10-CM | POA: Insufficient documentation

## 2018-11-03 DIAGNOSIS — Z1231 Encounter for screening mammogram for malignant neoplasm of breast: Secondary | ICD-10-CM

## 2018-11-03 DIAGNOSIS — Z01419 Encounter for gynecological examination (general) (routine) without abnormal findings: Secondary | ICD-10-CM | POA: Diagnosis not present

## 2018-11-03 DIAGNOSIS — Z800 Family history of malignant neoplasm of digestive organs: Secondary | ICD-10-CM

## 2018-11-07 LAB — CYTOLOGY - PAP: Diagnosis: NEGATIVE

## 2018-11-27 DIAGNOSIS — Z1371 Encounter for nonprocreative screening for genetic disease carrier status: Secondary | ICD-10-CM

## 2018-11-27 HISTORY — DX: Encounter for nonprocreative screening for genetic disease carrier status: Z13.71

## 2018-11-29 ENCOUNTER — Encounter: Payer: Self-pay | Admitting: Obstetrics and Gynecology

## 2018-12-14 ENCOUNTER — Telehealth: Payer: Self-pay | Admitting: Obstetrics and Gynecology

## 2018-12-14 NOTE — Telephone Encounter (Signed)
Pt aware of neg MyRisk results. IBIS=10.8%/riskscore=7.2%. No increased screening options recommended.   Patient understands these results only apply to her and her children, and this is not indicative of genetic testing results of her other family members. It is recommended that her other family members have genetic testing done.  Pt also understands negative genetic testing doesn't mean she will never get any of these cancers.   Hard copy mailed to pt. F/u prn.

## 2019-08-09 ENCOUNTER — Other Ambulatory Visit: Payer: Self-pay

## 2019-08-09 ENCOUNTER — Ambulatory Visit (INDEPENDENT_AMBULATORY_CARE_PROVIDER_SITE_OTHER): Payer: 59 | Admitting: Obstetrics and Gynecology

## 2019-08-09 ENCOUNTER — Encounter: Payer: Self-pay | Admitting: Obstetrics and Gynecology

## 2019-08-09 VITALS — BP 110/64 | Ht 68.0 in | Wt 138.0 lb

## 2019-08-09 DIAGNOSIS — N3001 Acute cystitis with hematuria: Secondary | ICD-10-CM | POA: Diagnosis not present

## 2019-08-09 LAB — POCT URINALYSIS DIPSTICK
Bilirubin, UA: NEGATIVE
Glucose, UA: NEGATIVE
Ketones, UA: NEGATIVE
Nitrite, UA: NEGATIVE
Protein, UA: NEGATIVE
Spec Grav, UA: 1.01 (ref 1.010–1.025)
pH, UA: 5 (ref 5.0–8.0)

## 2019-08-09 MED ORDER — NITROFURANTOIN MONOHYD MACRO 100 MG PO CAPS: 100.0000 mg | ORAL_CAPSULE | Freq: Two times a day (BID) | ORAL | 0 refills | Status: DC

## 2019-08-09 NOTE — Patient Instructions (Signed)
I value your feedback and entrusting us with your care. If you get a [place] patient survey, I would appreciate you taking the time to let us know about your experience today. Thank you!  As of January 05, 2019, your lab results will be released to your MyChart immediately, before I even have a chance to see them. Please give me time to review them and contact you if there are any abnormalities. Thank you for your patience.  

## 2019-08-09 NOTE — Progress Notes (Signed)
Kamilla Hands, Deirdre Evener, PA-C   Chief Complaint  Patient presents with  . Urinary Tract Infection    frequency only, no burning x 3 days    HPI:      [name] is a 59 y.o. G2P2003 whose LMP was No LMP recorded. Patient is postmenopausal., presents today for UTI sx of frequency and urgency for past few days, no hematuria, LBP, pelvic pain, dysuria, fevers, vag sx. No vag bleeding. Sx started after not drinking water one day while hiking, a trigger for pt. Doing lots of water, cranberry and Azo without relief. No recent UTIs.   Past Medical History:  Diagnosis Date  . BRCA negative 11/2018   MyRisk neg; IBIS=10.8%/riskscore=7.2%  . Family history of pancreatic cancer   . Melanoma ([place]) 2014  . Urinary tract infection     Past Surgical History:  Procedure Laterality Date  . CESAREAN SECTION     x2  . COLONOSCOPY  09/2015   normal   . MELANOMA EXCISION    . ORIF HUMERUS FRACTURE Right 03/08/2017   Procedure: OPEN REDUCTION INTERNAL FIXATION (ORIF) PROXIMAL HUMERUS FRACTURE;  Surgeon: Leim Fabry, MD;  Location: [facility] ORS;  Service: Orthopedics;  Laterality: Right;    Family History  Problem Relation Age of Onset  . Pancreatic cancer Maternal Grandmother 23  . Colon cancer Maternal Grandfather 60  . Breast cancer Other 64       daughter with pancreatic cancer    Social History   Socioeconomic History  . Marital status: Married    Spouse name: Not on file  . Number of children: Not on file  . Years of education: Not on file  . Highest education level: Not on file  Occupational History  . Not on file  Tobacco Use  . Smoking status: Never Smoker  . Smokeless tobacco: Never Used  Vaping Use  . Vaping Use: Never used  Substance and Sexual Activity  . Alcohol use: No  . Drug use: No  . Sexual activity: Yes    Birth control/protection: Post-menopausal  Other Topics Concern  . Not on file  Social History Narrative  . Not on file   Social Determinants of  Health   Financial Resource Strain:   . Difficulty of Paying Living Expenses:   Food Insecurity:   . Worried About Charity fundraiser in the Last Year:   . Arboriculturist in the Last Year:   Transportation Needs:   . Film/video editor (Medical):   Marland Kitchen Lack of Transportation (Non-Medical):   Physical Activity:   . Days of Exercise per Week:   . Minutes of Exercise per Session:   Stress:   . Feeling of Stress :   Social Connections:   . Frequency of Communication with Friends and Family:   . Frequency of Social Gatherings with Friends and Family:   . Attends Religious Services:   . Active Member of Clubs or Organizations:   . Attends Archivist Meetings:   Marland Kitchen Marital Status:   Intimate Partner Violence:   . Fear of Current or Ex-Partner:   . Emotionally Abused:   Marland Kitchen Physically Abused:   . Sexually Abused:     Outpatient Medications Prior to Visit  Medication Sig Dispense Refill  . loteprednol (LOTEMAX) 0.5 % ophthalmic suspension Place 2 drops into the right eye daily.    . Multiple Vitamin (MULTIVITAMIN WITH MINERALS) TABS tablet Take 1 tablet by mouth 3 (three) times  a week.    Marland Kitchen XIIDRA 5 % SOLN      No facility-administered medications prior to visit.      ROS:  Review of Systems  Constitutional: Negative for fever.  Gastrointestinal: Negative for blood in stool, constipation, diarrhea, nausea and vomiting.  Genitourinary: Positive for frequency and urgency. Negative for dyspareunia, dysuria, flank pain, hematuria, vaginal bleeding, vaginal discharge and vaginal pain.  Musculoskeletal: Negative for back pain.  Skin: Negative for rash.    OBJECTIVE:   Vitals:  BP 110/64   Ht '5\' 8"'$  (1.727 m)   Wt 138 lb (62.6 kg)   BMI 20.98 kg/m   Physical Exam Vitals reviewed.  Constitutional:      Appearance: She is well-developed. She is not ill-appearing or toxic-appearing.  Pulmonary:     Effort: Pulmonary effort is normal.  Abdominal:      Tenderness: There is no right CVA tenderness or left CVA tenderness.  Musculoskeletal:        General: Normal range of motion.     Cervical back: Normal range of motion.  Neurological:     General: No focal deficit present.     Mental Status: She is alert and oriented to person, place, and time.     Cranial Nerves: No cranial nerve deficit.  Psychiatric:        Behavior: Behavior normal.        Thought Content: Thought content normal.        Judgment: Judgment normal.     Results: Results for orders placed or performed in visit on 08/09/19 (from the past 24 hour(s))  POCT Urinalysis Dipstick     Status: Abnormal   Collection Time: 08/09/19  3:36 PM  Result Value Ref Range   Color, UA yellow    Clarity, UA cloudy    Glucose, UA Negative Negative   Bilirubin, UA neg    Ketones, UA neg    Spec Grav, UA 1.010 1.010 - 1.025   Blood, UA mod    pH, UA 5.0 5.0 - 8.0   Protein, UA Negative Negative   Urobilinogen, UA     Nitrite, UA neg    Leukocytes, UA Moderate (2+) (A) Negative   Appearance     Odor       Assessment/Plan: Acute cystitis with hematuria - Plan: nitrofurantoin, macrocrystal-monohydrate, (MACROBID) 100 MG capsule, POCT Urinalysis Dipstick, Urine Culture; Pos sx and UA. Rx macrobid. Check C&S. F/u prn.    Meds ordered this encounter  Medications  . nitrofurantoin, macrocrystal-monohydrate, (MACROBID) 100 MG capsule    Sig: Take 1 capsule (100 mg total) by mouth 2 (two) times daily for 5 days.    Dispense:  10 capsule    Refill:  0    Order Specific Question:   Supervising Provider    Answer:   Gae Dry [[id number]]      Return if symptoms worsen or fail to improve.  Harvir Patry B. Yasser Hepp, PA-C 08/09/2019 3:39 PM

## 2019-08-13 LAB — URINE CULTURE

## 2019-08-15 ENCOUNTER — Other Ambulatory Visit: Payer: Self-pay | Admitting: Obstetrics and Gynecology

## 2019-08-15 ENCOUNTER — Telehealth: Payer: Self-pay

## 2019-08-15 DIAGNOSIS — N3001 Acute cystitis with hematuria: Secondary | ICD-10-CM

## 2019-08-15 MED ORDER — NITROFURANTOIN MONOHYD MACRO 100 MG PO CAPS: 100.0000 mg | ORAL_CAPSULE | Freq: Two times a day (BID) | ORAL | 0 refills | Status: AC

## 2019-08-15 NOTE — Telephone Encounter (Signed)
Pt is on vacation and needs her UTI med's sent to [facility] on [address] at [place], she forgot her meds at home.

## 2019-08-15 NOTE — Telephone Encounter (Signed)
Rx macrobid eRxd.

## 2019-08-15 NOTE — Progress Notes (Signed)
Rx macrobid for UTI. Pt left abx at home, went on vacation.

## 2019-12-10 IMAGING — CR DG HIP (WITH OR WITHOUT PELVIS) 2-3V*R*
1 series · 3 of 3 positions shown · non-contrast
Comparison: None.

CLINICAL DATA: Status post fall with right hip pain.

EXAM:
DG HIP (WITH OR WITHOUT PELVIS) 2-3V RIGHT

[Series 1: dg hip unilat w or w/o pelvis 2-3 views  · non-contrast · 0.14mm/px · 3 of 3 slices shown]
[im 1/3]
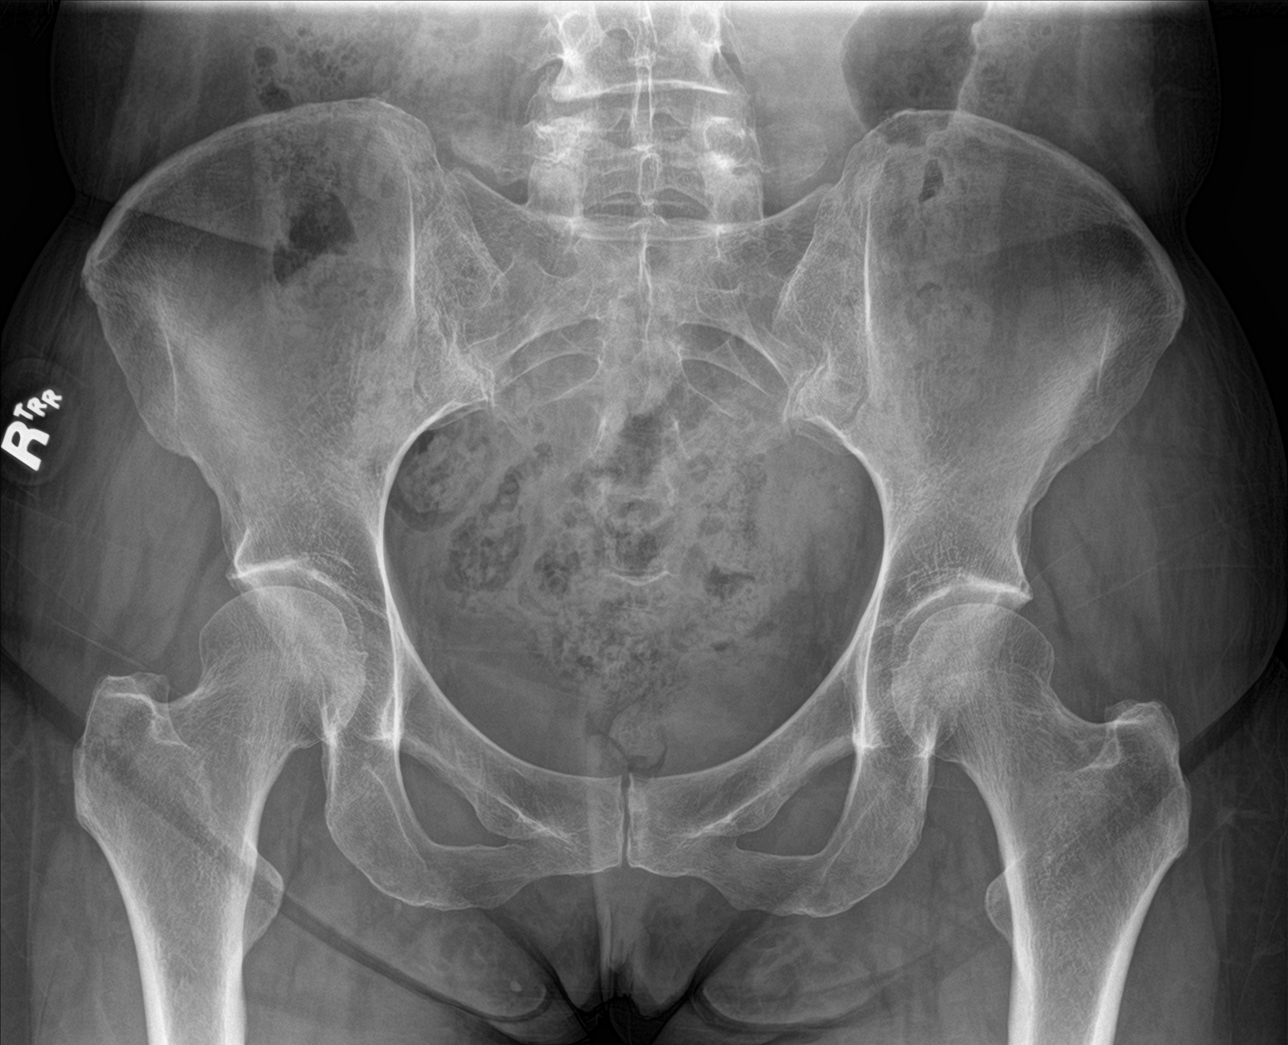
[im 2/3]
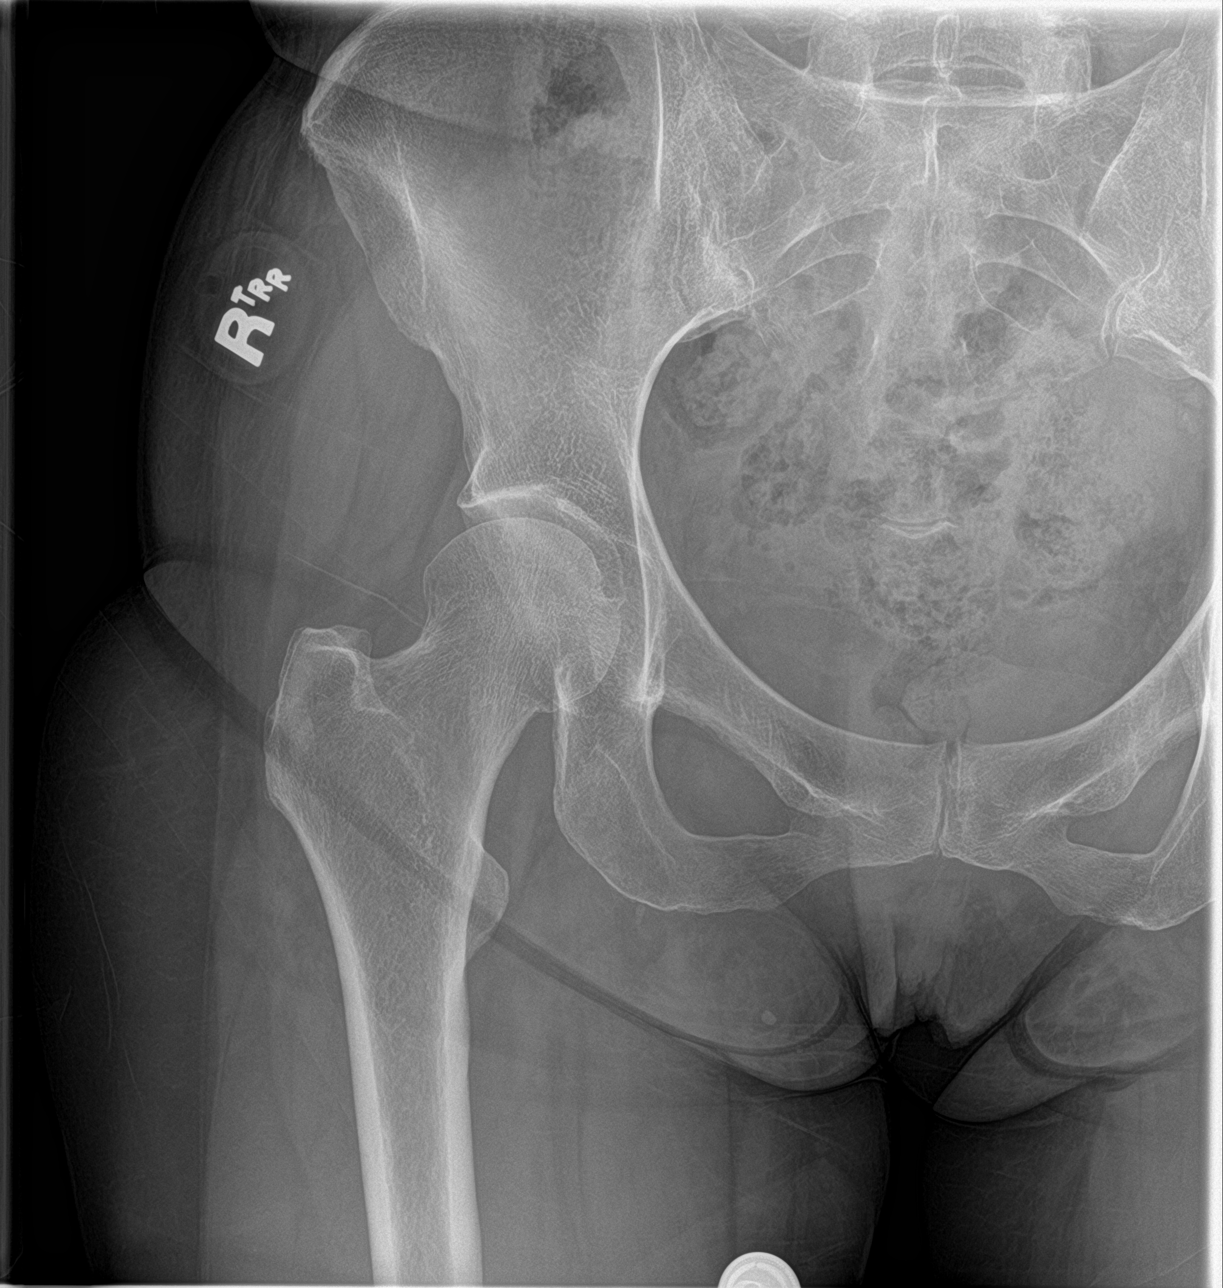
[im 3/3]
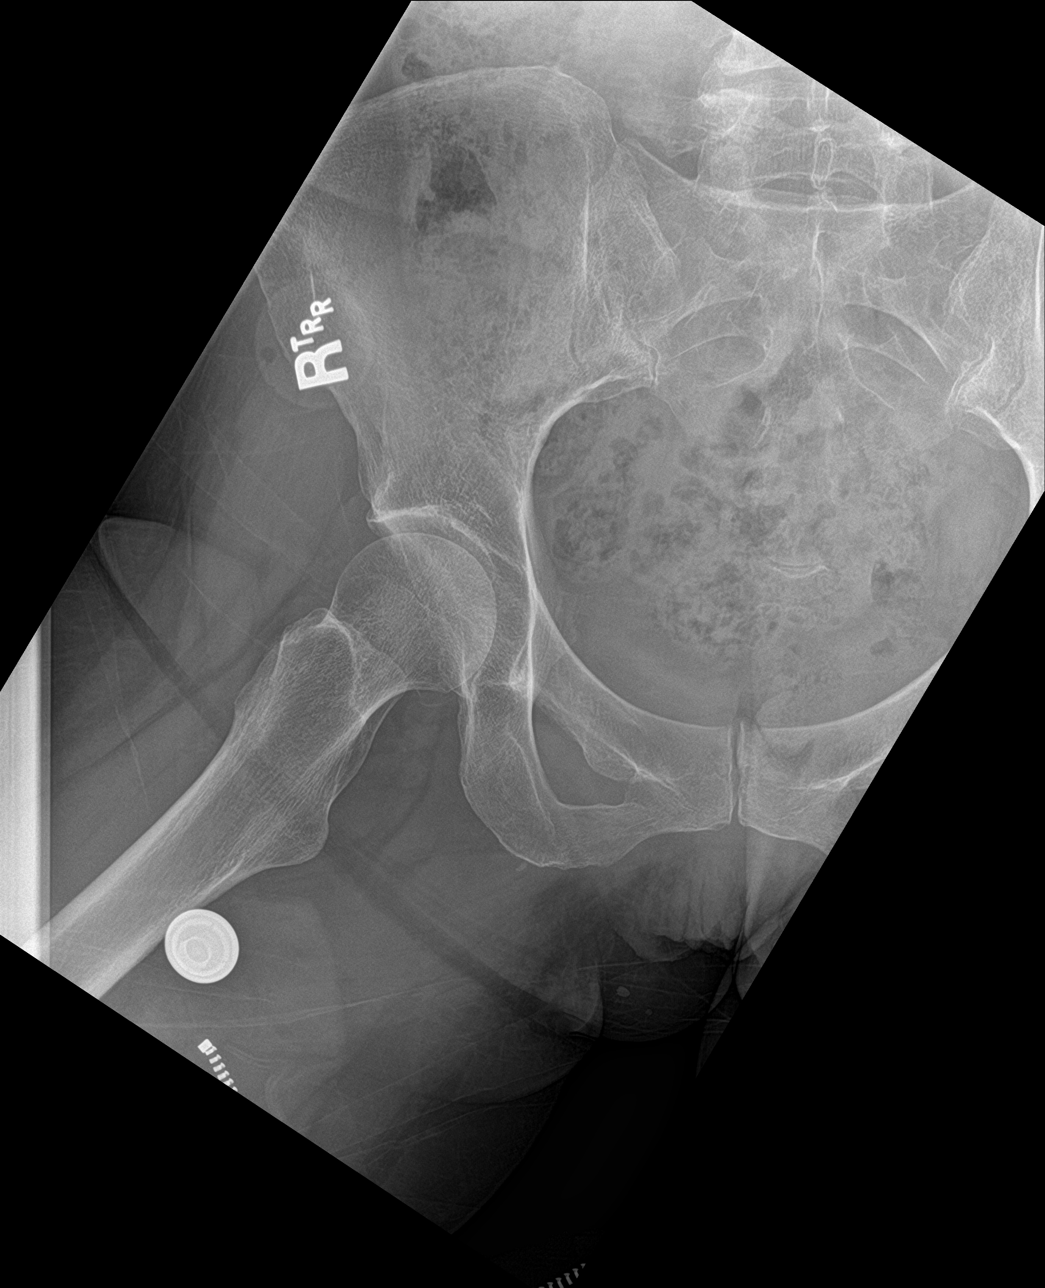

[3 of 3 positions shown; findings below may reference images not displayed]

FINDINGS: There is no evidence of hip fracture or dislocation. There is no
evidence of arthropathy or other focal bone abnormality.
IMPRESSION: No acute fracture or dislocation.

## 2019-12-23 IMAGING — DX DG SHOULDER 2+V PORT*R*
2 series · 2 of 2 positions shown · non-contrast
Comparison: 03/02/2017

CLINICAL DATA: Status post ORIF of proximal right humeral fracture

EXAM:
PORTABLE RIGHT SHOULDER

[shoulder ap]
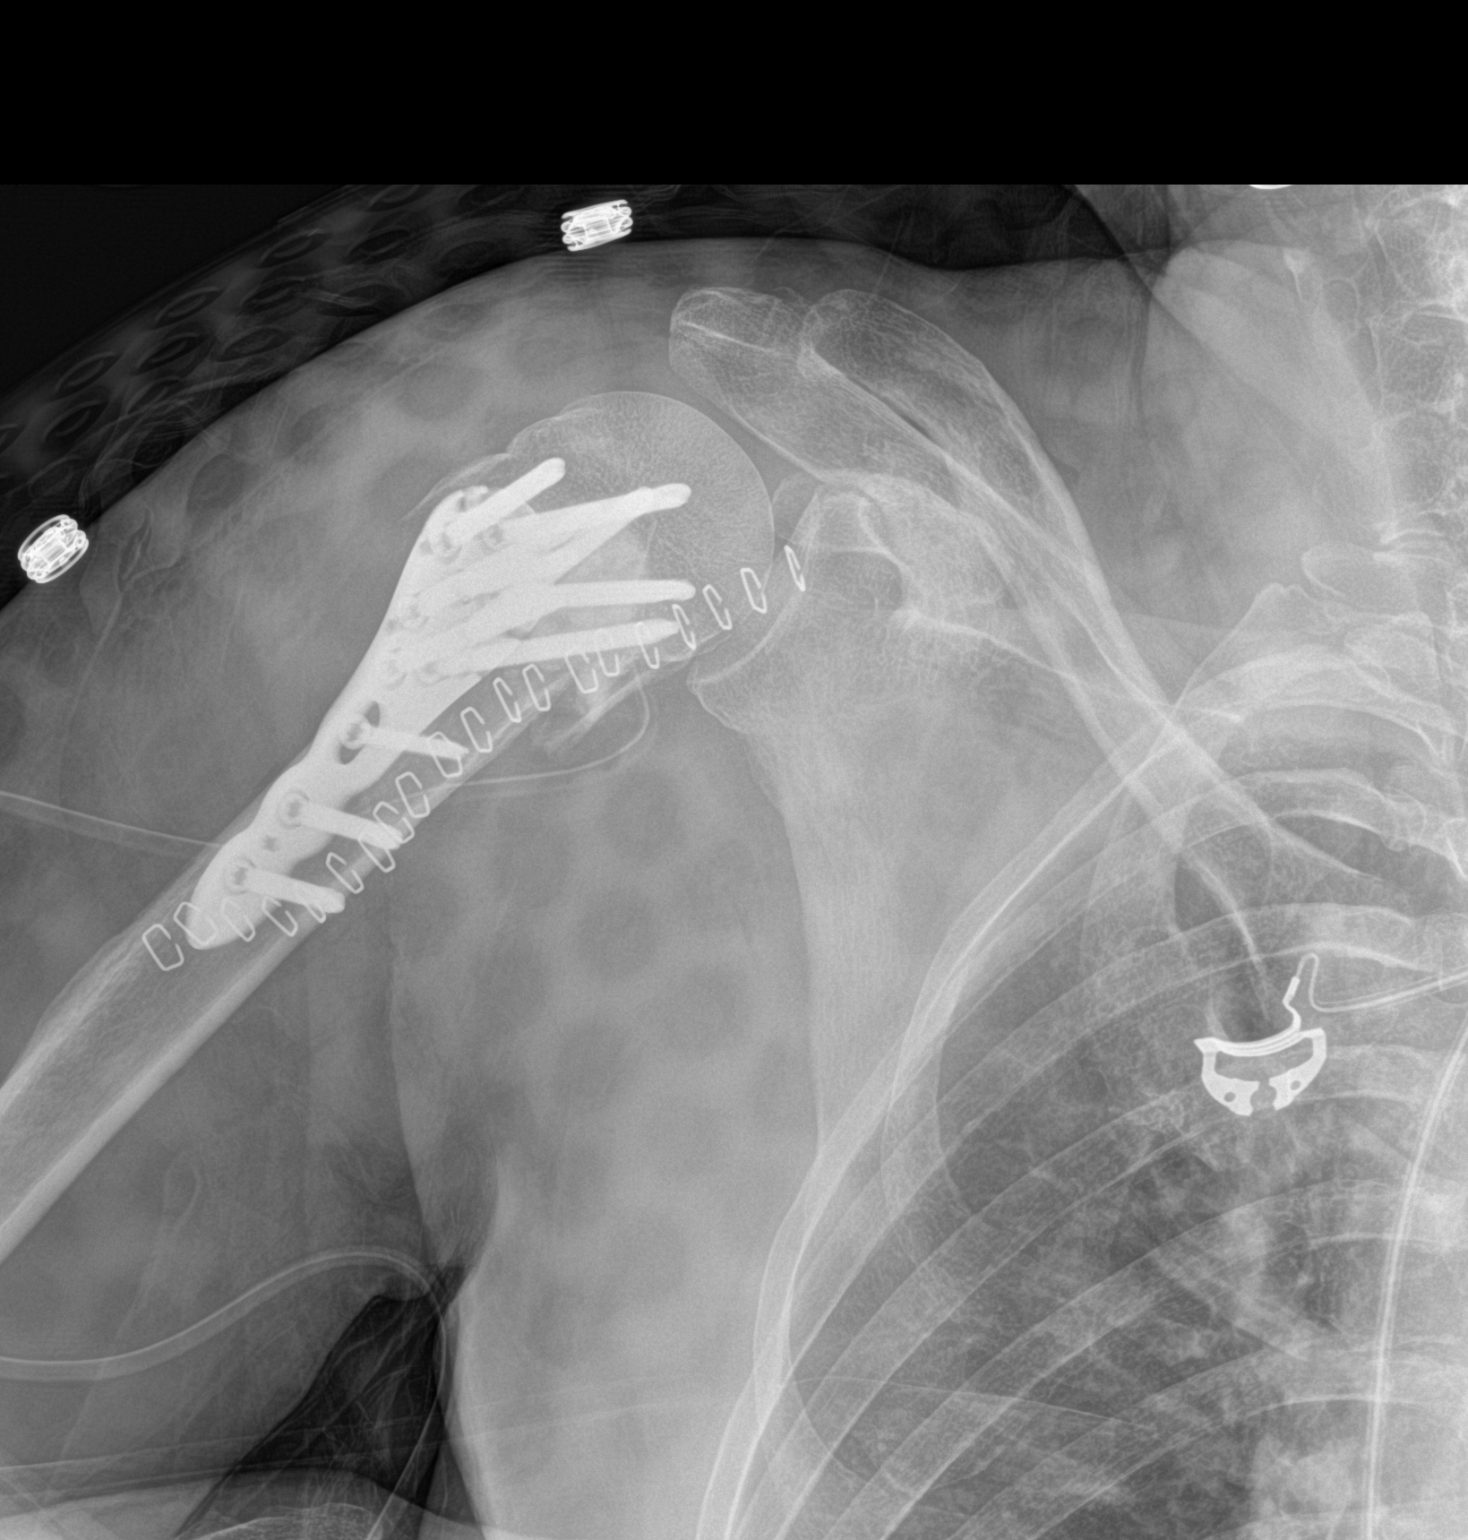

[shoulder obl]
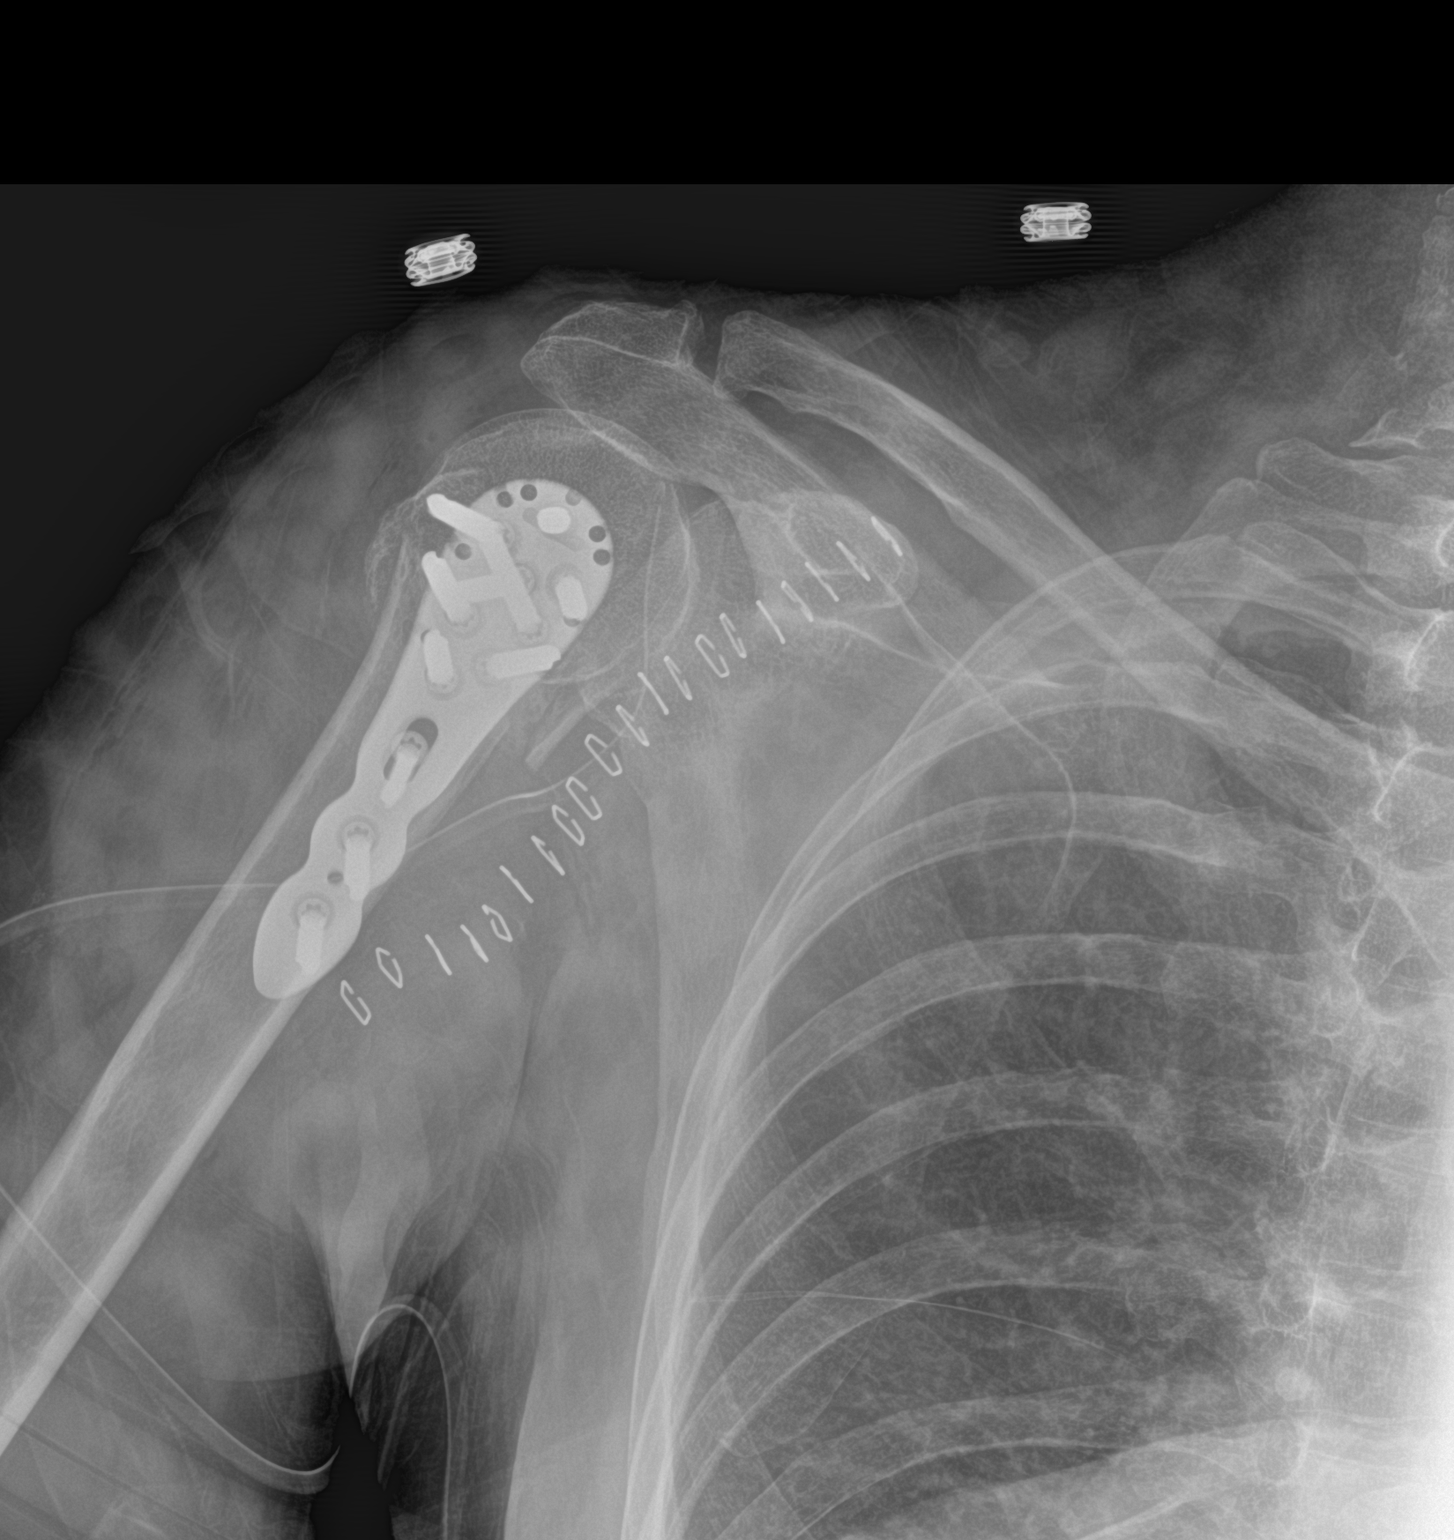

[2 of 2 positions shown; findings below may reference images not displayed]

FINDINGS: Fixation sideplate and multiple fixation screws are noted in the
proximal humerus. Fracture fragments are in near anatomic alignment.
Surgical drain is noted in place.
IMPRESSION: ORIF of right humeral fracture

## 2020-06-27 ENCOUNTER — Encounter: Payer: Self-pay | Admitting: Obstetrics and Gynecology

## 2020-07-02 ENCOUNTER — Encounter: Payer: Self-pay | Admitting: Obstetrics and Gynecology

## 2020-08-13 NOTE — Progress Notes (Signed)
Chief Complaint  Patient presents with   Gynecologic Exam    No concerns    HPI:      [name] is a 60 y.o. No obstetric history on file. who LMP was No LMP recorded. Patient is postmenopausal., presents today for her annual examination.  Her menses are absent and she is postmenopausal. She does not have PMB. She does not have vasomotor sx.   Sex activity: single partner, contraception - post menopausal status. She does not have vaginal dryness.  Last Pap: 11/03/18 Results were: no abnormalities/neg HPV DNA 2018. Pt likes yearly paps if possible.  Last mammogram: 06/27/20 at [facility]. Results were: normal--routine follow-up in 12 months There is a FH of breast cancer in her [place], genetic testing not idicated. There is a FH of pancreatic cancer in her [place] (daughter of [place] with breast cancer). There is no FH of ovarian cancer. Pt with hx of melanoma. The patient does do self-breast exams. Pt is MyRisk neg 11/20; IBIS=10.8%/riskscore=7.2%  Colonoscopy: colonoscopy 2017  without abnormalities. Repeat due after 10 yrs.  Tobacco use: The patient denies current or previous tobacco use. Alcohol use: none No drug use Exercise: moderately active  She does get adequate calcium and Vitamin D in her diet. She had normal fasting labs 09/2018. Total chol slightly elevated at 215 but HDL=100 and LDL=105. Would like checked again this yr.  Past Medical History:  Diagnosis Date   BRCA negative 11/2018   MyRisk neg; IBIS=10.8%/riskscore=7.2%   Family history of pancreatic cancer    Melanoma ([place]) 2014   Urinary tract infection     Past Surgical History:  Procedure Laterality Date   CESAREAN SECTION     x2   COLONOSCOPY  09/2015   normal    MELANOMA EXCISION     ORIF HUMERUS FRACTURE Right 03/08/2017   Procedure: OPEN REDUCTION INTERNAL FIXATION (ORIF) PROXIMAL HUMERUS FRACTURE;  Surgeon: Leim Fabry, MD;  Location: [facility] ORS;  Service: Orthopedics;  Laterality: Right;    Family  History  Problem Relation Age of Onset   Pancreatic cancer Maternal Grandmother 49   Colon cancer Maternal Grandfather 39   Breast cancer Other 27       daughter with pancreatic cancer    Social History   Socioeconomic History   Marital status: Married    Spouse name: Not on file   Number of children: Not on file   Years of education: Not on file   Highest education level: Not on file  Occupational History   Not on file  Tobacco Use   Smoking status: Never   Smokeless tobacco: Never  Vaping Use   Vaping Use: Never used  Substance and Sexual Activity   Alcohol use: No   Drug use: No   Sexual activity: Yes    Birth control/protection: Post-menopausal  Other Topics Concern   Not on file  Social History Narrative   Not on file   Social Determinants of Health   Financial Resource Strain: Not on file  Food Insecurity: Not on file  Transportation Needs: Not on file  Physical Activity: Not on file  Stress: Not on file  Social Connections: Not on file  Intimate Partner Violence: Not on file     Current Outpatient Medications:    loteprednol (LOTEMAX) 0.5 % ophthalmic suspension, Place 2 drops into the right eye daily., Disp: , Rfl:    Multiple Vitamin (MULTIVITAMIN WITH MINERALS) TABS tablet, Take 1 tablet by mouth 3 (three) times  a week., Disp: , Rfl:    ROS:  Review of Systems  Constitutional:  Negative for fatigue, fever and unexpected weight change.  Respiratory:  Negative for cough, shortness of breath and wheezing.   Cardiovascular:  Negative for chest pain, palpitations and leg swelling.  Gastrointestinal:  Negative for blood in stool, constipation, diarrhea, nausea and vomiting.  Endocrine: Negative for cold intolerance, heat intolerance and polyuria.  Genitourinary:  Negative for dyspareunia, dysuria, flank pain, frequency, genital sores, hematuria, menstrual problem, pelvic pain, urgency, vaginal bleeding, vaginal discharge and vaginal pain.   Musculoskeletal:  Negative for back pain, joint swelling and myalgias.  Skin:  Negative for rash.  Neurological:  Negative for dizziness, syncope, light-headedness, numbness and headaches.  Hematological:  Negative for adenopathy.  Psychiatric/Behavioral:  Negative for agitation, confusion, sleep disturbance and suicidal ideas. The patient is not nervous/anxious.     Objective: BP 100/70   Ht 5' 7.5" (1.715 m)   Wt 136 lb (61.7 kg)   BMI 20.99 kg/m    Physical Exam Constitutional:      Appearance: She is well-developed.  Genitourinary:     Vulva normal.     Right Labia: No rash, tenderness or lesions.    Left Labia: No tenderness, lesions or rash.    No vaginal discharge, erythema or tenderness.      Right Adnexa: not tender and no mass present.    Left Adnexa: not tender and no mass present.    No cervical motion tenderness, friability or polyp.     Uterus is not enlarged or tender.  Breasts:    Right: No mass, nipple discharge, skin change or tenderness.     Left: No mass, nipple discharge, skin change or tenderness.  Neck:     Thyroid: No thyromegaly.  Cardiovascular:     Rate and Rhythm: Normal rate and regular rhythm.     Heart sounds: Normal heart sounds. No murmur heard. Pulmonary:     Effort: Pulmonary effort is normal.     Breath sounds: Normal breath sounds.  Abdominal:     Palpations: Abdomen is soft.     Tenderness: There is no abdominal tenderness. There is no guarding or rebound.  Musculoskeletal:        General: Normal range of motion.     Cervical back: Normal range of motion.  Lymphadenopathy:     Cervical: No cervical adenopathy.  Neurological:     General: No focal deficit present.     Mental Status: She is alert and oriented to person, place, and time.     Cranial Nerves: No cranial nerve deficit.  Skin:    General: Skin is warm and dry.  Psychiatric:        Mood and Affect: Mood normal.        Behavior: Behavior normal.        Thought  Content: Thought content normal.        Judgment: Judgment normal.  Vitals reviewed.    Assessment/Plan:  Encounter for annual routine gynecological examination  Cervical cancer screening - Plan: Cytology - PAP  Screening for HPV (human papillomavirus) - Plan: Cytology - PAP  Encounter for screening mammogram for malignant neoplasm of breast; pt current on mammo  Blood tests for routine general physical examination - Plan: Lipid panel, Comprehensive metabolic panel  Screening cholesterol level - Plan: Lipid panel    F/U  Return in about 1 year (around 08/14/2021).  Maureen Anderson B. Kennieth Plotts, PA-C 08/14/2020 10:59 AM

## 2020-08-14 ENCOUNTER — Other Ambulatory Visit (HOSPITAL_COMMUNITY)
Admission: RE | Admit: 2020-08-14 | Discharge: 2020-08-14 | Disposition: A | Discharge status: Home / Self Care | Payer: 59 | Source: Ambulatory Visit | Attending: Obstetrics and Gynecology | Admitting: Obstetrics and Gynecology

## 2020-08-14 ENCOUNTER — Ambulatory Visit (INDEPENDENT_AMBULATORY_CARE_PROVIDER_SITE_OTHER): Payer: 59 | Admitting: Obstetrics and Gynecology

## 2020-08-14 ENCOUNTER — Encounter: Payer: Self-pay | Admitting: Obstetrics and Gynecology

## 2020-08-14 ENCOUNTER — Other Ambulatory Visit: Payer: Self-pay

## 2020-08-14 VITALS — BP 100/70 | Ht 67.5 in | Wt 136.0 lb

## 2020-08-14 DIAGNOSIS — Z124 Encounter for screening for malignant neoplasm of cervix: Secondary | ICD-10-CM | POA: Diagnosis not present

## 2020-08-14 DIAGNOSIS — Z0000 Encounter for general adult medical examination without abnormal findings: Secondary | ICD-10-CM

## 2020-08-14 DIAGNOSIS — Z01419 Encounter for gynecological examination (general) (routine) without abnormal findings: Secondary | ICD-10-CM

## 2020-08-14 DIAGNOSIS — Z1151 Encounter for screening for human papillomavirus (HPV): Secondary | ICD-10-CM

## 2020-08-14 DIAGNOSIS — Z1231 Encounter for screening mammogram for malignant neoplasm of breast: Secondary | ICD-10-CM | POA: Diagnosis not present

## 2020-08-14 DIAGNOSIS — Z13220 Encounter for screening for lipoid disorders: Secondary | ICD-10-CM

## 2020-08-14 NOTE — Patient Instructions (Signed)
I value your feedback and you entrusting us with your care. If you get a [facility] patient survey, I would appreciate you taking the time to let us know about your experience today. Thank you! ? ? ?

## 2020-08-16 LAB — CYTOLOGY - PAP
Comment: NEGATIVE
Diagnosis: NEGATIVE
High risk HPV: NEGATIVE

## 2020-08-22 ENCOUNTER — Other Ambulatory Visit: Payer: Self-pay

## 2020-08-22 ENCOUNTER — Other Ambulatory Visit: Payer: 59

## 2020-08-22 DIAGNOSIS — Z13220 Encounter for screening for lipoid disorders: Secondary | ICD-10-CM

## 2020-08-22 DIAGNOSIS — Z0000 Encounter for general adult medical examination without abnormal findings: Secondary | ICD-10-CM

## 2020-08-23 LAB — COMPREHENSIVE METABOLIC PANEL
ALT: 12 IU/L (ref 0–32)
AST: 22 IU/L (ref 0–40)
Albumin/Globulin Ratio: 2.3 — ABNORMAL HIGH (ref 1.2–2.2)
Albumin: 4.5 g/dL (ref 3.8–4.9)
Alkaline Phosphatase: 56 IU/L (ref 44–121)
BUN/Creatinine Ratio: 14 (ref 9–23)
BUN: 11 mg/dL (ref 6–24)
Bilirubin Total: 0.6 mg/dL (ref 0.0–1.2)
CO2: 24 mmol/L (ref 20–29)
Calcium: 9.4 mg/dL (ref 8.7–10.2)
Chloride: 104 mmol/L (ref 96–106)
Creatinine, Ser: 0.79 mg/dL (ref 0.57–1.00)
Globulin, Total: 2 g/dL (ref 1.5–4.5)
Glucose: 78 mg/dL (ref 65–99)
Potassium: 4.1 mmol/L (ref 3.5–5.2)
Sodium: 141 mmol/L (ref 134–144)
Total Protein: 6.5 g/dL (ref 6.0–8.5)
eGFR: 87 mL/min/{1.73_m2} (ref >59)

## 2020-08-23 LAB — LIPID PANEL
Chol/HDL Ratio: 2.3 ratio (ref 0.0–4.4)
Cholesterol, Total: 216 mg/dL — ABNORMAL HIGH (ref 100–199)
HDL: 92 mg/dL (ref >39)
LDL Chol Calc (NIH): 113 mg/dL — ABNORMAL HIGH (ref 0–99)
Triglycerides: 60 mg/dL (ref 0–149)
VLDL Cholesterol Cal: 11 mg/dL (ref 5–40)

## 2021-10-01 ENCOUNTER — Other Ambulatory Visit: Payer: Self-pay | Admitting: Physical Medicine & Rehabilitation

## 2021-10-01 DIAGNOSIS — M5416 Radiculopathy, lumbar region: Secondary | ICD-10-CM

## 2021-10-06 ENCOUNTER — Other Ambulatory Visit: Payer: Self-pay | Admitting: Physical Medicine & Rehabilitation

## 2021-10-06 DIAGNOSIS — M5416 Radiculopathy, lumbar region: Secondary | ICD-10-CM

## 2021-10-17 ENCOUNTER — Other Ambulatory Visit: Payer: Self-pay

## 2021-11-27 ENCOUNTER — Ambulatory Visit (INDEPENDENT_AMBULATORY_CARE_PROVIDER_SITE_OTHER): Payer: 59

## 2021-11-27 VITALS — BP 111/71 | HR 66 | Ht 67.5 in | Wt 142.0 lb

## 2021-11-27 DIAGNOSIS — R399 Unspecified symptoms and signs involving the genitourinary system: Secondary | ICD-10-CM | POA: Diagnosis not present

## 2021-11-27 LAB — POCT URINALYSIS DIPSTICK
Appearance: ABNORMAL
Bilirubin, UA: NEGATIVE
Blood, UA: NEGATIVE
Glucose, UA: NEGATIVE
Ketones, UA: NEGATIVE
Leukocytes, UA: NEGATIVE
Nitrite, UA: NEGATIVE
Odor: NORMAL
Protein, UA: NEGATIVE
Spec Grav, UA: 1.025 (ref 1.010–1.025)
Urobilinogen, UA: 0.2 E.U./dL
pH, UA: 6 (ref 5.0–8.0)

## 2021-11-27 NOTE — Progress Notes (Signed)
   Nurse Visit  Subjective   Patient ID: Maureen Anderson, female    DOB: [birth year]  Age: 61 y.o. MRN: [id number]  Chief Complaint  Patient presents with   Urinary Tract Infection  complains of urinary frequency, urgency and dysuria x 4 days, without flank pain, fever, or abnormal vaginal discharge or bleeding. Reports chills. She has taken AZO since Monday due to traveling and unable to be seen for evaluation.     Objective:     BP 111/71   Pulse 66   Ht 5' 7.5" (1.715 m)   Wt 142 lb (64.4 kg)   BMI 21.91 kg/m    Appears well, in no apparent distress.  Vital signs are normal. The abdomen is soft without tenderness, guarding, mass, rebound or organomegaly. No CVA tenderness or inguinal adenopathy noted. Urine dipstick shows negative for all components.    Assessment & Plan:  ASSESSMENT: UTI uncomplicated without evidence of pyelonephritis  PLAN: Urine Culture sent.  Treatment- push fluids, may use AZO OTC prn.  She will check my chart for results and contact office for treatment if results is positive as culture will likely result over the weekend. Call or return to clinic prn if these symptoms worsen or fail to improve as anticipated.    Otila Kluver, LPN

## 2021-11-30 LAB — URINE CULTURE

## 2022-03-03 ENCOUNTER — Ambulatory Visit (INDEPENDENT_AMBULATORY_CARE_PROVIDER_SITE_OTHER): Payer: 59 | Admitting: Obstetrics & Gynecology

## 2022-03-03 ENCOUNTER — Encounter: Payer: Self-pay | Admitting: Obstetrics & Gynecology

## 2022-03-03 VITALS — BP 100/60 | Ht 68.0 in | Wt 142.0 lb

## 2022-03-03 DIAGNOSIS — Z1231 Encounter for screening mammogram for malignant neoplasm of breast: Secondary | ICD-10-CM

## 2022-03-03 DIAGNOSIS — R350 Frequency of micturition: Secondary | ICD-10-CM | POA: Diagnosis not present

## 2022-03-03 DIAGNOSIS — N898 Other specified noninflammatory disorders of vagina: Secondary | ICD-10-CM

## 2022-03-03 LAB — POCT URINALYSIS DIPSTICK
Bilirubin, UA: NEGATIVE
Blood, UA: POSITIVE
Glucose, UA: NEGATIVE
Ketones, UA: NEGATIVE
Leukocytes, UA: NEGATIVE
Nitrite, UA: NEGATIVE
Protein, UA: NEGATIVE
Spec Grav, UA: 1.01 (ref 1.010–1.025)
Urobilinogen, UA: 1 E.U./dL
pH, UA: 6 (ref 5.0–8.0)

## 2022-03-03 MED ORDER — IMVEXXY MAINTENANCE PACK 10 MCG VA INST: VAGINAL_INSERT | VAGINAL | 12 refills | Status: DC

## 2022-03-03 MED ORDER — SULFAMETHOXAZOLE-TRIMETHOPRIM 800-160 MG PO TABS: 1.0000 | ORAL_TABLET | Freq: Two times a day (BID) | ORAL | 1 refills | Status: DC

## 2022-03-03 NOTE — Progress Notes (Signed)
   Established Patient Office Visit  Subjective   Patient ID: Maureen Anderson, female    DOB: Oct 06, 1960  Age: 62 y.o. MRN: [id number]  Chief Complaint  Patient presents with   Urinary Tract Infection    HPI   62 yo married P3 here with symptoms of UTI. She has been using AZO and drinking cranberry juice for a week. She gets a UTI if she forgets to void after sex. She does have symptomatic vaginal atrophy as well. Her last UTI grew E coli sensitive to bactrim.  ROS- pap negative in 2022, mammogram 06/2020 Objective:     BP 100/60   Ht '5\' 8"'$  (1.727 m)   Wt 142 lb (64.4 kg)   BMI 21.59 kg/m    Physical Exam   Results for orders placed or performed in visit on 03/03/22  POCT urinalysis dipstick  Result Value Ref Range   Color, UA orange    Clarity, UA     Glucose, UA Negative Negative   Bilirubin, UA Negative    Ketones, UA Negative    Spec Grav, UA 1.010 1.010 - 1.025   Blood, UA Positive    pH, UA 6.0 5.0 - 8.0   Protein, UA Negative Negative   Urobilinogen, UA 1.0 0.2 or 1.0 E.U./dL   Nitrite, UA Negative    Leukocytes, UA Negative Negative   Appearance     Odor       The 10-year ASCVD risk score (Arnett DK, et al., 2019) is: 1.4%   Well nourished, well hydrated White female, no apparent distress She is ambulating and conversing normally. No CVAT  Assessment & Plan:    UTI- treat with bactrim DS Symptomatic VVA- treat with Imvexxy. Samples for nightly use x 1 week, then change to 10 mcg 2 times per week This should also help with UTI prevention. I rec'd D- mannose for UTI prevention also. Mammogram ordered  Problem List Items Addressed This Visit   None Visit Diagnoses     Urine frequency    -  Primary   Relevant Orders   Urine Culture   POCT urinalysis dipstick (Completed)       No follow-ups on file.    Emily Filbert, MD

## 2022-03-05 LAB — URINE CULTURE: Organism ID, Bacteria: NO GROWTH

## 2022-03-08 ENCOUNTER — Other Ambulatory Visit: Payer: Self-pay | Admitting: Obstetrics and Gynecology

## 2022-03-08 DIAGNOSIS — Z0000 Encounter for general adult medical examination without abnormal findings: Secondary | ICD-10-CM

## 2022-03-08 DIAGNOSIS — Z13220 Encounter for screening for lipoid disorders: Secondary | ICD-10-CM

## 2022-03-09 ENCOUNTER — Encounter: Payer: Self-pay | Admitting: Obstetrics and Gynecology

## 2022-04-07 ENCOUNTER — Other Ambulatory Visit: Payer: 59

## 2022-04-07 DIAGNOSIS — Z0000 Encounter for general adult medical examination without abnormal findings: Secondary | ICD-10-CM

## 2022-04-07 DIAGNOSIS — Z13220 Encounter for screening for lipoid disorders: Secondary | ICD-10-CM

## 2022-04-08 LAB — LIPID PANEL
Chol/HDL Ratio: 2.2 ratio (ref 0.0–4.4)
Cholesterol, Total: 225 mg/dL — ABNORMAL HIGH (ref 100–199)
HDL: 104 mg/dL (ref >39)
LDL Chol Calc (NIH): 112 mg/dL — ABNORMAL HIGH (ref 0–99)
Triglycerides: 54 mg/dL (ref 0–149)
VLDL Cholesterol Cal: 9 mg/dL (ref 5–40)

## 2022-04-08 LAB — COMPREHENSIVE METABOLIC PANEL
ALT: 14 IU/L (ref 0–32)
AST: 22 IU/L (ref 0–40)
Albumin/Globulin Ratio: 2 (ref 1.2–2.2)
Albumin: 4.5 g/dL (ref 3.8–4.9)
Alkaline Phosphatase: 71 IU/L (ref 44–121)
BUN/Creatinine Ratio: 19 (ref 9–23)
BUN: 15 mg/dL (ref 6–24)
Bilirubin Total: 0.6 mg/dL (ref 0.0–1.2)
CO2: 23 mmol/L (ref 20–29)
Calcium: 9.6 mg/dL (ref 8.7–10.2)
Chloride: 104 mmol/L (ref 96–106)
Creatinine, Ser: 0.81 mg/dL (ref 0.57–1.00)
Globulin, Total: 2.3 g/dL (ref 1.5–4.5)
Glucose: 81 mg/dL (ref 70–99)
Potassium: 4.3 mmol/L (ref 3.5–5.2)
Sodium: 142 mmol/L (ref 134–144)
Total Protein: 6.8 g/dL (ref 6.0–8.5)
eGFR: 84 mL/min/{1.73_m2} (ref >59)

## 2022-04-08 NOTE — Progress Notes (Unsigned)
No chief complaint on file.   HPI:      [name] is a 62 y.o. No obstetric history on file. who LMP was No LMP recorded. Patient is postmenopausal., presents today for her annual examination.  Her menses are absent and she is postmenopausal. She does not have PMB. She does not have vasomotor sx.   Sex activity: single partner, contraception - post menopausal status. She does now have vaginal dryness. Started imvexxy 10 mg 2/24 with DR. Dove. Also had UTI at that time.  Last Pap: 08/14/20 Results were: no abnormalities/neg HPV DNA. Hx of pos HPV DNA years ago. Pt likes yearly paps if possible.  Last mammogram: 03/05/22 at [facility]. Results were: normal--routine follow-up in 12 months There is a FH of breast cancer in her [place], genetic testing not idicated. There is a FH of pancreatic cancer in her MGM (daughter of [place] with breast cancer). There is no FH of ovarian cancer. Pt with hx of melanoma. The patient does do self-breast exams. Pt is MyRisk neg 11/20; IBIS=10.8%/riskscore=7.2%  Colonoscopy: colonoscopy 2017  without abnormalities. Repeat due after 10 yrs.  Tobacco use: The patient denies current or previous tobacco use. Alcohol use: none No drug use Exercise: moderately active  She does get adequate calcium and Vitamin D in her diet. She had normal fasting labs 04/07/22. Total chol slightly elevated at 225 but HDL=104 and LDL=125.  Past Medical History:  Diagnosis Date   BRCA negative 11/2018   MyRisk neg; IBIS=10.8%/riskscore=7.2%   Family history of pancreatic cancer    Melanoma ([place]) 2014   Urinary tract infection     Past Surgical History:  Procedure Laterality Date   CESAREAN SECTION     x2   COLONOSCOPY  09/2015   normal    MELANOMA EXCISION     ORIF HUMERUS FRACTURE Right 03/08/2017   Procedure: OPEN REDUCTION INTERNAL FIXATION (ORIF) PROXIMAL HUMERUS FRACTURE;  Surgeon: Leim Fabry, MD;  Location: [facility] ORS;  Service: Orthopedics;  Laterality: Right;     Family History  Problem Relation Age of Onset   Pancreatic cancer Maternal Grandmother 26   Colon cancer Maternal Grandfather 81   Breast cancer Other 46       daughter with pancreatic cancer    Social History   Socioeconomic History   Marital status: Married    Spouse name: Not on file   Number of children: Not on file   Years of education: Not on file   Highest education level: Not on file  Occupational History   Not on file  Tobacco Use   Smoking status: Never   Smokeless tobacco: Never  Vaping Use   Vaping Use: Never used  Substance and Sexual Activity   Alcohol use: No   Drug use: No   Sexual activity: Yes    Birth control/protection: Post-menopausal  Other Topics Concern   Not on file  Social History Narrative   Not on file   Social Determinants of Health   Financial Resource Strain: Not on file  Food Insecurity: Not on file  Transportation Needs: Not on file  Physical Activity: Not on file  Stress: Not on file  Social Connections: Not on file  Intimate Partner Violence: Not on file     Current Outpatient Medications:    Estradiol (IMVEXXY MAINTENANCE PACK) 10 MCG INST, 1 tablet per vagina twice per week at bedtime, Disp: 24 each, Rfl: 12   loteprednol (LOTEMAX) 0.5 % ophthalmic suspension, Place 2 drops  into the right eye daily. (Patient not taking: Reported on 03/03/2022), Disp: , Rfl:    Multiple Vitamin (MULTIVITAMIN WITH MINERALS) TABS tablet, Take 1 tablet by mouth 3 (three) times a week. (Patient not taking: Reported on 03/03/2022), Disp: , Rfl:    sulfamethoxazole-trimethoprim (BACTRIM DS) 800-160 MG tablet, Take 1 tablet by mouth 2 (two) times daily., Disp: 14 tablet, Rfl: 1   ROS:  Review of Systems  Constitutional:  Negative for fatigue, fever and unexpected weight change.  Respiratory:  Negative for cough, shortness of breath and wheezing.   Cardiovascular:  Negative for chest pain, palpitations and leg swelling.  Gastrointestinal:   Negative for blood in stool, constipation, diarrhea, nausea and vomiting.  Endocrine: Negative for cold intolerance, heat intolerance and polyuria.  Genitourinary:  Negative for dyspareunia, dysuria, flank pain, frequency, genital sores, hematuria, menstrual problem, pelvic pain, urgency, vaginal bleeding, vaginal discharge and vaginal pain.  Musculoskeletal:  Negative for back pain, joint swelling and myalgias.  Skin:  Negative for rash.  Neurological:  Negative for dizziness, syncope, light-headedness, numbness and headaches.  Hematological:  Negative for adenopathy.  Psychiatric/Behavioral:  Negative for agitation, confusion, sleep disturbance and suicidal ideas. The patient is not nervous/anxious.      Objective: There were no vitals taken for this visit.   Physical Exam Constitutional:      Appearance: She is well-developed.  Genitourinary:     Vulva normal.     Right Labia: No rash, tenderness or lesions.    Left Labia: No tenderness, lesions or rash.    No vaginal discharge, erythema or tenderness.      Right Adnexa: not tender and no mass present.    Left Adnexa: not tender and no mass present.    No cervical motion tenderness, friability or polyp.     Uterus is not enlarged or tender.  Breasts:    Right: No mass, nipple discharge, skin change or tenderness.     Left: No mass, nipple discharge, skin change or tenderness.  Neck:     Thyroid: No thyromegaly.  Cardiovascular:     Rate and Rhythm: Normal rate and regular rhythm.     Heart sounds: Normal heart sounds. No murmur heard. Pulmonary:     Effort: Pulmonary effort is normal.     Breath sounds: Normal breath sounds.  Abdominal:     Palpations: Abdomen is soft.     Tenderness: There is no abdominal tenderness. There is no guarding or rebound.  Musculoskeletal:        General: Normal range of motion.     Cervical back: Normal range of motion.  Lymphadenopathy:     Cervical: No cervical adenopathy.   Neurological:     General: No focal deficit present.     Mental Status: She is alert and oriented to person, place, and time.     Cranial Nerves: No cranial nerve deficit.  Skin:    General: Skin is warm and dry.  Psychiatric:        Mood and Affect: Mood normal.        Behavior: Behavior normal.        Thought Content: Thought content normal.        Judgment: Judgment normal.  Vitals reviewed.     Assessment/Plan:  Encounter for annual routine gynecological examination  Cervical cancer screening - Plan: Cytology - PAP  Screening for HPV (human papillomavirus) - Plan: Cytology - PAP  Encounter for screening mammogram for malignant neoplasm of breast; pt current  on mammo  Blood tests for routine general physical examination - Plan: Lipid panel, Comprehensive metabolic panel  Screening cholesterol level - Plan: Lipid panel    F/U  No follow-ups on file.  Jefry Lesinski B. Lucillie Kiesel, PA-C 04/08/2022 8:55 PM

## 2022-04-09 ENCOUNTER — Other Ambulatory Visit (HOSPITAL_COMMUNITY)
Admission: RE | Admit: 2022-04-09 | Discharge: 2022-04-09 | Disposition: A | Discharge status: Home / Self Care | Payer: Managed Care, Other (non HMO) | Source: Ambulatory Visit | Attending: Obstetrics and Gynecology | Admitting: Obstetrics and Gynecology

## 2022-04-09 ENCOUNTER — Encounter: Payer: Self-pay | Admitting: Obstetrics and Gynecology

## 2022-04-09 ENCOUNTER — Ambulatory Visit (INDEPENDENT_AMBULATORY_CARE_PROVIDER_SITE_OTHER): Payer: 59 | Admitting: Obstetrics and Gynecology

## 2022-04-09 VITALS — BP 104/70 | Ht 68.0 in | Wt 140.0 lb

## 2022-04-09 DIAGNOSIS — Z01419 Encounter for gynecological examination (general) (routine) without abnormal findings: Secondary | ICD-10-CM

## 2022-04-09 DIAGNOSIS — R399 Unspecified symptoms and signs involving the genitourinary system: Secondary | ICD-10-CM | POA: Diagnosis not present

## 2022-04-09 DIAGNOSIS — Z01411 Encounter for gynecological examination (general) (routine) with abnormal findings: Secondary | ICD-10-CM | POA: Diagnosis not present

## 2022-04-09 DIAGNOSIS — N898 Other specified noninflammatory disorders of vagina: Secondary | ICD-10-CM

## 2022-04-09 DIAGNOSIS — Z124 Encounter for screening for malignant neoplasm of cervix: Secondary | ICD-10-CM | POA: Diagnosis present

## 2022-04-09 DIAGNOSIS — Z1231 Encounter for screening mammogram for malignant neoplasm of breast: Secondary | ICD-10-CM

## 2022-04-09 LAB — POCT URINALYSIS DIPSTICK
Bilirubin, UA: NEGATIVE
Blood, UA: NEGATIVE
Glucose, UA: NEGATIVE
Ketones, UA: NEGATIVE
Leukocytes, UA: NEGATIVE
Nitrite, UA: NEGATIVE
Protein, UA: NEGATIVE
Spec Grav, UA: 1.015 (ref 1.010–1.025)
pH, UA: 5 (ref 5.0–8.0)

## 2022-04-09 NOTE — Patient Instructions (Signed)
I value your feedback and you entrusting us with your care. If you get a [facility] patient survey, I would appreciate you taking the time to let us know about your experience today. Thank you! ? ? ?

## 2022-04-13 LAB — CYTOLOGY - PAP
Adequacy: ABSENT
Diagnosis: NEGATIVE

## 2022-05-27 ENCOUNTER — Ambulatory Visit
Admission: EM | Admit: 2022-05-27 | Discharge: 2022-05-27 | Disposition: A | Discharge status: Home / Self Care | Payer: Managed Care, Other (non HMO) | Attending: Urgent Care | Admitting: Urgent Care

## 2022-05-27 DIAGNOSIS — B9689 Other specified bacterial agents as the cause of diseases classified elsewhere: Secondary | ICD-10-CM

## 2022-05-27 DIAGNOSIS — J019 Acute sinusitis, unspecified: Secondary | ICD-10-CM | POA: Diagnosis not present

## 2022-05-27 MED ORDER — PREDNISONE 20 MG PO TABS
40.0000 mg | ORAL_TABLET | Freq: Every day | ORAL | 0 refills | Status: AC
Start: 2022-05-27 — End: 2022-06-01

## 2022-05-27 MED ORDER — AMOXICILLIN-POT CLAVULANATE 875-125 MG PO TABS
1.0000 | ORAL_TABLET | Freq: Two times a day (BID) | ORAL | 0 refills | Status: DC
Start: 2022-05-27 — End: 2023-10-04

## 2022-05-27 NOTE — ED Triage Notes (Signed)
Pt presents with cough, congestion for 2 weeks. Had fever of 101 on Saturday then none since, but having ear pain. Taking mucinx with no relief.

## 2022-05-27 NOTE — Discharge Instructions (Signed)
Follow up here or with your primary care provider if your symptoms are worsening or not improving with treatment.     

## 2022-05-27 NOTE — ED Provider Notes (Signed)
Maureen Anderson    CSN: [id number] Arrival date & time: 05/27/22  1255      History   Chief Complaint Chief Complaint  Patient presents with   Cough    HPI Maureen Anderson is a 62 y.o. female.    Cough   Presents to urgent care with concern for cough and congestion x 2 weeks.  She endorses fever of 101 F 4 days ago.  No symptoms since.  Endorses ear pain.  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   ABDOMINAL ABSCESS 04/20/2010    Past Surgical History:  Procedure Laterality Date   CESAREAN SECTION     x2   COLONOSCOPY  09/2015   normal    MELANOMA EXCISION     ORIF HUMERUS FRACTURE Right 03/08/2017   Procedure: OPEN REDUCTION INTERNAL FIXATION (ORIF) PROXIMAL HUMERUS FRACTURE;  Surgeon: Signa Kell, MD;  Location: [facility] ORS;  Service: Orthopedics;  Laterality: Right;    OB History     Gravida  2   Para  2   Term  2   Preterm      AB      Living  3      SAB      IAB      Ectopic      Multiple  1   Live Births  3            Home Medications    Prior to Admission medications   Medication Sig Start Date End Date Taking? Authorizing Provider  Multiple Vitamin (MULTIVITAMIN WITH MINERALS) TABS tablet Take 1 tablet by mouth 3 (three) times a week.   Yes [provider]    Family History Family History  Problem Relation Age of Onset   Pancreatic cancer Maternal Grandmother 29   Colon cancer Maternal Grandfather 54   Breast cancer Other 28       daughter with pancreatic cancer    Social History Social History   Tobacco Use   Smoking status: Never   Smokeless tobacco: Never  Vaping Use   Vaping Use: Never used   Substance Use Topics   Alcohol use: No   Drug use: No     Allergies   Patient has no known allergies.   Review of Systems Review of Systems  Respiratory:  Positive for cough.      Physical Exam Triage Vital Signs ED Triage Vitals  Enc Vitals Group     BP 05/27/22 1313 102/65     Pulse Rate 05/27/22 1313 74     Resp 05/27/22 1313 16     Temp 05/27/22 1313 98.6 F (37 C)     Temp Source 05/27/22 1313 Oral     SpO2 05/27/22 1313 95 %     Weight --      Height --      Head Circumference --      Peak Flow --      Pain Score 05/27/22 1324 5     Pain Loc --      Pain Edu? --      Excl. in GC? --    No data found.  Updated  Vital Signs BP 102/65 (BP Location: Left Arm)   Pulse 74   Temp 98.6 F (37 C) (Oral)   Resp 16   SpO2 95%   Visual Acuity Right Eye Distance:   Left Eye Distance:   Bilateral Distance:    Right Eye Near:   Left Eye Near:    Bilateral Near:     Physical Exam Vitals reviewed.  Constitutional:      Appearance: Normal appearance. She is ill-appearing.  HENT:     Right Ear: Tympanic membrane normal.     Left Ear: Tympanic membrane normal.     Nose: Congestion present.     Mouth/Throat:     Mouth: Mucous membranes are moist.     Pharynx: No oropharyngeal exudate or posterior oropharyngeal erythema.  Cardiovascular:     Rate and Rhythm: Normal rate and regular rhythm.     Pulses: Normal pulses.     Heart sounds: Normal heart sounds.  Pulmonary:     Effort: Pulmonary effort is normal.     Breath sounds: Normal breath sounds.  Skin:    General: Skin is warm and dry.  Neurological:     General: No focal deficit present.     Mental Status: She is alert and oriented to person, place, and time.  Psychiatric:        Mood and Affect: Mood normal.        Behavior: Behavior normal.      UC Treatments / Results  Labs (all labs ordered are listed, but only abnormal results are displayed) Labs Reviewed - No data to  display  EKG   Radiology No results found.  Procedures Procedures (including critical care time)  Medications Ordered in UC Medications - No data to display  Initial Impression / Assessment and Plan / UC Course  I have reviewed the triage vital signs and the nursing notes.  Pertinent labs & imaging results that were available during my care of the patient were reviewed by me and considered in my medical decision making (see chart for details).   Maureen Anderson is a 62 y.o. female presenting with sinus congestion and cough. Patient is afebrile without recent antipyretics, satting well on room air. Overall is ill appearing though non-toxic, well hydrated, without respiratory distress. Pulmonary exam is unremarkable.  Lungs CTAB without wheezing, rhonchi, rales.  TMs are WNL bilaterally.  No pharyngeal erythema or peritonsillar exudates.  Sinuses are acutely tender to palpation.  Of symptoms, suspect bacterial sinusitis secondary to past viral infection.  Will treat with Augmentin x 7 days.  Also prescribing prednisone to relieve bronchitis and sinus inflammation.  Counseled patient on potential for adverse effects with medications prescribed/recommended today, ER and return-to-clinic precautions discussed, patient verbalized understanding and agreement with care plan.   Final Clinical Impressions(s) / UC Diagnoses   Final diagnoses:  None   Discharge Instructions   None    ED Prescriptions   None    PDMP not reviewed this encounter.   Charma Igo, [place] 05/27/22 1342

## 2022-09-23 ENCOUNTER — Telehealth: Payer: Self-pay | Admitting: Obstetrics and Gynecology

## 2022-09-23 NOTE — Telephone Encounter (Signed)
Pls call pt to get more info on sx. Is she using imvexxy like we discussed 3/24?

## 2022-09-23 NOTE — Telephone Encounter (Signed)
This patient is calling to see if she could have an referral sent to urogyn due to an on going infection she has had. Please advise?

## 2022-09-25 NOTE — Telephone Encounter (Signed)
LMTRC. Sx are c/w urethritis. Best to do vag ERT locally. Urology ref better than urogyn but best to try vag ERT first. Asked pt to call me back.

## 2023-06-26 ENCOUNTER — Ambulatory Visit
Admission: EM | Admit: 2023-06-26 | Discharge: 2023-06-26 | Disposition: A | Discharge status: Home / Self Care | Attending: Emergency Medicine | Admitting: Emergency Medicine

## 2023-06-26 DIAGNOSIS — J069 Acute upper respiratory infection, unspecified: Secondary | ICD-10-CM | POA: Diagnosis not present

## 2023-06-26 MED ORDER — PROMETHAZINE-DM 6.25-15 MG/5ML PO SYRP: 5.0000 mL | ORAL_SOLUTION | Freq: Four times a day (QID) | ORAL | 0 refills | Status: DC | PRN

## 2023-06-26 MED ORDER — PREDNISONE 10 MG (21) PO TBPK: ORAL_TABLET | Freq: Every day | ORAL | 0 refills | Status: DC

## 2023-06-26 NOTE — ED Triage Notes (Signed)
 Patient to Urgent Care with complaints of dry/ deep cough and mid level back pain/ fatigue/ ear pain/ nasal congestion. Unsure of any fevers.  Symptoms started Wednesday.   Taking otc cough medicine.

## 2023-06-26 NOTE — Discharge Instructions (Addendum)
 Take the prednisone as directed.    Take the promethazine DM as directed.  Do not drive, operate machinery, drink alcohol, or perform dangerous activities while taking this medication as it may cause drowsiness.  Follow up with your primary care provider if your symptoms are not improving.

## 2023-06-26 NOTE — ED Provider Notes (Signed)
 Arlander Bellman    CSN: [id number] Arrival date & time: 06/26/23  1213      History   Chief Complaint Chief Complaint  Patient presents with   Cough   Otalgia    HPI Maureen Anderson is a 63 y.o. female.  Patient presents with ear pain, congestion, cough, back pain, fatigue x 3 days.  He denies fever, shortness of breath, vomiting, diarrhea.  She has been treating her symptoms with OTC cough syrup.  She also has been taking her husband's promethazine  DM cough syrup.  She reports no pertinent medical history.  The history is provided by the patient and medical records.    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   ABDOMINAL ABSCESS 04/20/2010    Past Surgical History:  Procedure Laterality Date   CESAREAN SECTION     x2   COLONOSCOPY  09/2015   normal    MELANOMA EXCISION     ORIF HUMERUS FRACTURE Right 03/08/2017   Procedure: OPEN REDUCTION INTERNAL FIXATION (ORIF) PROXIMAL HUMERUS FRACTURE;  Surgeon: Lorri Rota, MD;  Location: [facility] ORS;  Service: Orthopedics;  Laterality: Right;    OB History     Gravida  2   Para  2   Term  2   Preterm      AB      Living  3      SAB      IAB      Ectopic      Multiple  1   Live Births  3            Home Medications    Prior to Admission medications   Medication Sig Start Date End Date Taking? Authorizing Provider  predniSONE  (STERAPRED UNI-PAK 21 TAB) 10 MG (21) TBPK tablet Take by mouth daily. As directed 06/26/23  Yes Wellington Half, NP  promethazine -dextromethorphan (PROMETHAZINE -DM) 6.25-15 MG/5ML syrup Take 5 mLs by mouth 4 (four) times daily as needed. 06/26/23  Yes Wellington Half, NP   amoxicillin -clavulanate (AUGMENTIN ) 875-125 MG tablet Take 1 tablet by mouth every 12 (twelve) hours. Patient not taking: Reported on 06/26/2023 05/27/22   Immordino, Mara [place], FNP  Multiple Vitamin (MULTIVITAMIN WITH MINERALS) TABS tablet Take 1 tablet by mouth 3 (three) times a week.    [provider]    Family History Family History  Problem Relation Age of Onset   Pancreatic cancer Maternal Grandmother 95   Colon cancer Maternal Grandfather 70   Breast cancer Other 7       daughter with pancreatic cancer    Social History Social History   Tobacco Use   Smoking status: Never   Smokeless tobacco: Never  Vaping Use   Vaping status: Never Used  Substance Use Topics   Alcohol use: No   Drug use: No     Allergies   Patient has no known allergies.   Review of Systems Review of Systems  Constitutional:  Negative for chills and fever.  HENT:  Positive for congestion and ear pain. Negative for sore throat.   Respiratory:  Positive for cough. Negative for shortness of breath.   Cardiovascular:  Negative for chest pain  and palpitations.  Gastrointestinal:  Negative for diarrhea and vomiting.  Genitourinary:  Negative for dysuria and hematuria.  Musculoskeletal:  Positive for back pain. Negative for gait problem.     Physical Exam Triage Vital Signs ED Triage Vitals  Encounter Vitals Group     BP      Systolic BP Percentile      Diastolic BP Percentile      Pulse      Resp      Temp      Temp src      SpO2      Weight      Height      Head Circumference      Peak Flow      Pain Score      Pain Loc      Pain Education      Exclude from Growth Chart    No data found.  Updated Vital Signs BP 109/72   Pulse 91   Temp 97.8 F (36.6 C)   Resp 18   SpO2 95%   Visual Acuity Right Eye Distance:   Left Eye Distance:   Bilateral Distance:    Right Eye Near:   Left Eye Near:    Bilateral Near:     Physical Exam Constitutional:      General:  She is not in acute distress. HENT:     Right Ear: Tympanic membrane normal.     Left Ear: Tympanic membrane normal.     Nose: Rhinorrhea present.     Mouth/Throat:     Mouth: Mucous membranes are moist.     Pharynx: Oropharynx is clear.     Comments: PND Cardiovascular:     Rate and Rhythm: Normal rate and regular rhythm.     Heart sounds: Normal heart sounds.  Pulmonary:     Effort: Pulmonary effort is normal. No respiratory distress.     Breath sounds: Normal breath sounds.  Neurological:     Mental Status: She is alert.      UC Treatments / Results  Labs (all labs ordered are listed, but only abnormal results are displayed) Labs Reviewed - No data to display  EKG   Radiology No results found.  Procedures Procedures (including critical care time)  Medications Ordered in UC Medications - No data to display  Initial Impression / Assessment and Plan / UC Course  I have reviewed the triage vital signs and the nursing notes.  Pertinent labs & imaging results that were available during my care of the patient were reviewed by me and considered in my medical decision making (see chart for details).    Viral URI.  Afebrile and vital signs are stable.  Lungs are clear and O2 sat is 95% on room air.  Patient has been symptomatic for 3 days.  Antibiotic stewardship discussed.  Treating today with promethazine  DM and prednisone  taper.  Precautions for drowsiness with promethazine  discussed.  Education provided on viral respiratory infection.  Instructed patient to follow-up with her PCP if her symptoms or not improving.  She agrees to plan of care. Final Clinical Impressions(s) / UC Diagnoses   Final diagnoses:  Viral URI     Discharge Instructions      Take the prednisone  as directed.    Take the promethazine  DM as directed.  Do not drive, operate machinery, drink alcohol, or perform dangerous activities while taking this medication as it may cause  drowsiness.  Follow-up with your primary care provider  if your symptoms are not improving.    ED Prescriptions     Medication Sig Dispense Auth. Provider   predniSONE  (STERAPRED UNI-PAK 21 TAB) 10 MG (21) TBPK tablet Take by mouth daily. As directed 21 tablet Wellington Half, NP   promethazine -dextromethorphan (PROMETHAZINE -DM) 6.25-15 MG/5ML syrup Take 5 mLs by mouth 4 (four) times daily as needed. 118 mL Wellington Half, NP      PDMP not reviewed this encounter.   Wellington Half, NP 06/26/23 1256

## 2023-09-30 ENCOUNTER — Encounter: Payer: Self-pay | Admitting: Obstetrics and Gynecology

## 2023-10-03 NOTE — Progress Notes (Unsigned)
 No chief complaint on file.   HPI:      [name] is a 63 y.o. No obstetric history on file. who LMP was No LMP recorded. Patient is postmenopausal., presents today for her annual examination.  Her menses are absent and she is postmenopausal. She does not have PMB. She does not have vasomotor sx.   Sex activity: single partner, contraception - post menopausal status. She does not have vaginal dryness/pain/bleeding. Was given imvexxy  10 mg 2/24 with Dr. Starla due to vaginal atrophy, hasn't started it yet (has samples and Rx). Also had UTI sx at that time, treated with Bactrim , but C&S neg. Still having tingling sensation vaginally, even when not urinating. No other UTI sx. Uses any type of soap.   Last Pap: 04/09/22 Results were NILM; 08/14/20 Results were: no abnormalities/neg HPV DNA. Hx of pos HPV DNA years ago. Pt likes yearly paps if possible.  Last mammogram: 03/05/22 at [facility]. Results were: normal--routine follow-up in 12 months There is a FH of breast cancer in her MGGM, genetic testing not idicated. There is a FH of pancreatic cancer in her MGM (daughter of MGGM with breast cancer). There is no FH of ovarian cancer. Pt with hx of melanoma. The patient does do self-breast exams. Pt is MyRisk neg 11/20; IBIS=10.8%/riskscore=7.2%  Colonoscopy: colonoscopy 2017  without abnormalities. Repeat due after 10 yrs.  Tobacco use: The patient denies current or previous tobacco use. Alcohol use: occas No drug use Exercise: moderately active  She does get adequate calcium and Vitamin D in her diet. She had normal fasting labs 04/07/22. Total chol slightly elevated at 225 but HDL=104 and LDL=125.  Past Medical History:  Diagnosis Date   BRCA negative 11/2018   MyRisk neg; IBIS=10.8%/riskscore=7.2%   Family history of pancreatic cancer    Melanoma (HCC) 2014   Urinary tract infection     Past Surgical History:  Procedure Laterality Date   CESAREAN SECTION     x2   COLONOSCOPY   09/2015   normal    MELANOMA EXCISION     ORIF HUMERUS FRACTURE Right 03/08/2017   Procedure: OPEN REDUCTION INTERNAL FIXATION (ORIF) PROXIMAL HUMERUS FRACTURE;  Surgeon: Tobie Priest, MD;  Location: [facility] ORS;  Service: Orthopedics;  Laterality: Right;    Family History  Problem Relation Age of Onset   Pancreatic cancer Maternal Grandmother 49   Colon cancer Maternal Grandfather 51   Breast cancer Other 45       daughter with pancreatic cancer    Social History   Socioeconomic History   Marital status: Married    Spouse name: Not on file   Number of children: Not on file   Years of education: Not on file   Highest education level: Not on file  Occupational History   Not on file  Tobacco Use   Smoking status: Never   Smokeless tobacco: Never  Vaping Use   Vaping status: Never Used  Substance and Sexual Activity   Alcohol use: No   Drug use: No   Sexual activity: Yes    Birth control/protection: Post-menopausal  Other Topics Concern   Not on file  Social History Narrative   Not on file   Social Drivers of Health   Financial Resource Strain: Not on file  Food Insecurity: Not on file  Transportation Needs: Not on file  Physical Activity: Not on file  Stress: Not on file  Social Connections: Not on file  Intimate Partner Violence: Not on  file     Current Outpatient Medications:    amoxicillin -clavulanate (AUGMENTIN ) 875-125 MG tablet, Take 1 tablet by mouth every 12 (twelve) hours. (Patient not taking: Reported on 06/26/2023), Disp: 14 tablet, Rfl: 0   Multiple Vitamin (MULTIVITAMIN WITH MINERALS) TABS tablet, Take 1 tablet by mouth 3 (three) times a week., Disp: , Rfl:    predniSONE  (STERAPRED UNI-PAK 21 TAB) 10 MG (21) TBPK tablet, Take by mouth daily. As directed, Disp: 21 tablet, Rfl: 0   promethazine -dextromethorphan (PROMETHAZINE -DM) 6.25-15 MG/5ML syrup, Take 5 mLs by mouth 4 (four) times daily as needed., Disp: 118 mL, Rfl: 0   ROS:  Review of Systems   Constitutional:  Negative for fatigue, fever and unexpected weight change.  Respiratory:  Negative for cough, shortness of breath and wheezing.   Cardiovascular:  Negative for chest pain, palpitations and leg swelling.  Gastrointestinal:  Negative for blood in stool, constipation, diarrhea, nausea and vomiting.  Endocrine: Negative for cold intolerance, heat intolerance and polyuria.  Genitourinary:  Negative for dyspareunia, dysuria, flank pain, frequency, genital sores, hematuria, menstrual problem, pelvic pain, urgency, vaginal bleeding, vaginal discharge and vaginal pain.  Musculoskeletal:  Negative for back pain, joint swelling and myalgias.  Skin:  Negative for rash.  Neurological:  Negative for dizziness, syncope, light-headedness, numbness and headaches.  Hematological:  Negative for adenopathy.  Psychiatric/Behavioral:  Negative for agitation, confusion, sleep disturbance and suicidal ideas. The patient is not nervous/anxious.      Objective: There were no vitals taken for this visit.   Physical Exam Constitutional:      Appearance: She is well-developed.  Genitourinary:     Vulva normal.     Right Labia: No rash, tenderness or lesions.    Left Labia: No tenderness, lesions or rash.    No vaginal discharge, erythema or tenderness.     Moderate vaginal atrophy present.     Right Adnexa: not tender and no mass present.    Left Adnexa: not tender and no mass present.    No cervical motion tenderness, friability or polyp.     Uterus is not enlarged or tender.  Breasts:    Right: No mass, nipple discharge, skin change or tenderness.     Left: No mass, nipple discharge, skin change or tenderness.  Neck:     Thyroid: No thyromegaly.  Cardiovascular:     Rate and Rhythm: Normal rate and regular rhythm.     Heart sounds: Normal heart sounds. No murmur heard. Pulmonary:     Effort: Pulmonary effort is normal.     Breath sounds: Normal breath sounds.  Abdominal:      Palpations: Abdomen is soft.     Tenderness: There is no abdominal tenderness. There is no guarding or rebound.  Musculoskeletal:        General: Normal range of motion.     Cervical back: Normal range of motion.  Lymphadenopathy:     Cervical: No cervical adenopathy.  Neurological:     General: No focal deficit present.     Mental Status: She is alert and oriented to person, place, and time.     Cranial Nerves: No cranial nerve deficit.  Skin:    General: Skin is warm and dry.  Psychiatric:        Mood and Affect: Mood normal.        Behavior: Behavior normal.        Thought Content: Thought content normal.        Judgment: Judgment normal.  Vitals reviewed.    No results found for this or any previous visit (from the past 24 hours).   Assessment/Plan:  Encounter for annual routine gynecological examination  Cervical cancer screening - Plan: Cytology - PAP  Encounter for screening mammogram for malignant neoplasm of breast; pt current on mammo  Vaginal atrophy--discussed benefits of vag ERT. Can do imvexxy  vs crm. Pt will start imvexxy  and f/u prn.   UTI symptoms - Plan: POCT Urinalysis Dipstick; neg UA (neg C&S 2/24). Increase water, dove sens skin soap, add vag ERT. F/u prn.      F/U  No follow-ups on file.  Brynda Heick B. Satoru Milich, PA-C 10/03/2023 8:42 AM

## 2023-10-04 ENCOUNTER — Ambulatory Visit (INDEPENDENT_AMBULATORY_CARE_PROVIDER_SITE_OTHER): Admitting: Obstetrics and Gynecology

## 2023-10-04 ENCOUNTER — Other Ambulatory Visit (HOSPITAL_COMMUNITY)
Admission: RE | Admit: 2023-10-04 | Discharge: 2023-10-04 | Disposition: A | Discharge status: Home / Self Care | Source: Ambulatory Visit | Attending: Obstetrics and Gynecology | Admitting: Obstetrics and Gynecology

## 2023-10-04 ENCOUNTER — Encounter: Payer: Self-pay | Admitting: Obstetrics and Gynecology

## 2023-10-04 VITALS — BP 94/58 | HR 77 | Ht 68.0 in | Wt 139.0 lb

## 2023-10-04 DIAGNOSIS — N952 Postmenopausal atrophic vaginitis: Secondary | ICD-10-CM | POA: Diagnosis not present

## 2023-10-04 DIAGNOSIS — Z124 Encounter for screening for malignant neoplasm of cervix: Secondary | ICD-10-CM

## 2023-10-04 DIAGNOSIS — Z0000 Encounter for general adult medical examination without abnormal findings: Secondary | ICD-10-CM

## 2023-10-04 DIAGNOSIS — N941 Unspecified dyspareunia: Secondary | ICD-10-CM | POA: Diagnosis not present

## 2023-10-04 DIAGNOSIS — Z1231 Encounter for screening mammogram for malignant neoplasm of breast: Secondary | ICD-10-CM

## 2023-10-04 DIAGNOSIS — Z13220 Encounter for screening for lipoid disorders: Secondary | ICD-10-CM

## 2023-10-04 DIAGNOSIS — Z01419 Encounter for gynecological examination (general) (routine) without abnormal findings: Secondary | ICD-10-CM | POA: Diagnosis not present

## 2023-10-04 DIAGNOSIS — Z1151 Encounter for screening for human papillomavirus (HPV): Secondary | ICD-10-CM | POA: Diagnosis present

## 2023-10-04 DIAGNOSIS — N898 Other specified noninflammatory disorders of vagina: Secondary | ICD-10-CM

## 2023-10-04 MED ORDER — ESTRADIOL 0.1 MG/GM VA CREA
TOPICAL_CREAM | VAGINAL | 1 refills | Status: DC
Start: 2023-10-04 — End: 2023-10-28

## 2023-10-04 NOTE — Patient Instructions (Signed)
 I value your feedback and you entrusting [place] with your care. If you get a [place] patient survey, I would appreciate you taking the time to let us know about your experience today. Thank you! ? ? ?

## 2023-10-06 ENCOUNTER — Other Ambulatory Visit

## 2023-10-06 LAB — CYTOLOGY - PAP
Comment: NEGATIVE
Diagnosis: NEGATIVE
High risk HPV: NEGATIVE

## 2023-10-07 ENCOUNTER — Ambulatory Visit: Payer: Self-pay | Admitting: Obstetrics and Gynecology

## 2023-10-07 ENCOUNTER — Other Ambulatory Visit

## 2023-10-07 LAB — COMPREHENSIVE METABOLIC PANEL WITH GFR
ALT: 13 IU/L (ref 0–32)
AST: 22 IU/L (ref 0–40)
Albumin: 4.4 g/dL (ref 3.8–4.9)
Alkaline Phosphatase: 61 IU/L (ref 44–121)
BUN/Creatinine Ratio: 15 (ref 12–28)
BUN: 12 mg/dL (ref 8–27)
Bilirubin Total: 0.7 mg/dL (ref 0.0–1.2)
CO2: 25 mmol/L (ref 20–29)
Calcium: 9.5 mg/dL (ref 8.7–10.3)
Chloride: 103 mmol/L (ref 96–106)
Creatinine, Ser: 0.78 mg/dL (ref 0.57–1.00)
Globulin, Total: 2.3 g/dL (ref 1.5–4.5)
Glucose: 79 mg/dL (ref 70–99)
Potassium: 4.2 mmol/L (ref 3.5–5.2)
Sodium: 142 mmol/L (ref 134–144)
Total Protein: 6.7 g/dL (ref 6.0–8.5)
eGFR: 87 mL/min/1.73 (ref >59)

## 2023-10-07 LAB — CBC WITH DIFFERENTIAL/PLATELET
Basophils Absolute: 0.1 x10E3/uL (ref 0.0–0.2)
Basos: 2 %
EOS (ABSOLUTE): 0.1 x10E3/uL (ref 0.0–0.4)
Eos: 3 %
Hematocrit: 38.3 % (ref 34.0–46.6)
Hemoglobin: 12.8 g/dL (ref 11.1–15.9)
Immature Grans (Abs): 0 x10E3/uL (ref 0.0–0.1)
Immature Granulocytes: 0 %
Lymphocytes Absolute: 1.5 x10E3/uL (ref 0.7–3.1)
Lymphs: 42 %
MCH: 31.3 pg (ref 26.6–33.0)
MCHC: 33.4 g/dL (ref 31.5–35.7)
MCV: 94 fL (ref 79–97)
Monocytes Absolute: 0.4 x10E3/uL (ref 0.1–0.9)
Monocytes: 12 %
Neutrophils Absolute: 1.5 x10E3/uL (ref 1.4–7.0)
Neutrophils: 41 %
Platelets: 213 x10E3/uL (ref 150–450)
RBC: 4.09 x10E6/uL (ref 3.77–5.28)
RDW: 12.4 % (ref 11.7–15.4)
WBC: 3.6 x10E3/uL (ref 3.4–10.8)

## 2023-10-07 LAB — LIPID PANEL
Chol/HDL Ratio: 2.2 ratio (ref 0.0–4.4)
Cholesterol, Total: 218 mg/dL — ABNORMAL HIGH (ref 100–199)
HDL: 98 mg/dL (ref >39)
LDL Chol Calc (NIH): 111 mg/dL — ABNORMAL HIGH (ref 0–99)
Triglycerides: 50 mg/dL (ref 0–149)
VLDL Cholesterol Cal: 9 mg/dL (ref 5–40)

## 2023-10-28 ENCOUNTER — Other Ambulatory Visit: Payer: Self-pay | Admitting: Obstetrics and Gynecology

## 2023-10-28 MED ORDER — IMVEXXY MAINTENANCE PACK 10 MCG VA INST: 10.0000 ug | VAGINAL_INSERT | VAGINAL | 3 refills | Status: AC

## 2023-10-28 NOTE — Progress Notes (Signed)
 Rx imvexxy  due to messiness of estrace  vag crm.

## 2023-11-23 ENCOUNTER — Ambulatory Visit: Payer: Self-pay

## 2023-11-24 ENCOUNTER — Ambulatory Visit
Admission: RE | Admit: 2023-11-24 | Discharge: 2023-11-24 | Disposition: A | Discharge status: Home / Self Care | Payer: Self-pay | Source: Ambulatory Visit | Attending: Emergency Medicine | Admitting: Emergency Medicine

## 2023-11-24 VITALS — BP 105/71 | HR 77 | Temp 98.2°F | Resp 18

## 2023-11-24 DIAGNOSIS — R350 Frequency of micturition: Secondary | ICD-10-CM | POA: Insufficient documentation

## 2023-11-24 DIAGNOSIS — N3000 Acute cystitis without hematuria: Secondary | ICD-10-CM | POA: Insufficient documentation

## 2023-11-24 LAB — POCT URINE DIPSTICK
Bilirubin, UA: NEGATIVE
Blood, UA: NEGATIVE
Glucose, UA: NEGATIVE mg/dL
Ketones, POC UA: NEGATIVE mg/dL
Nitrite, UA: POSITIVE — AB
POC PROTEIN,UA: NEGATIVE
Spec Grav, UA: 1.01 (ref 1.010–1.025)
Urobilinogen, UA: 0.2 U/dL
pH, UA: 6 (ref 5.0–8.0)

## 2023-11-24 MED ORDER — NITROFURANTOIN MONOHYD MACRO 100 MG PO CAPS: 100.0000 mg | ORAL_CAPSULE | Freq: Two times a day (BID) | ORAL | 0 refills | Status: AC

## 2023-11-24 NOTE — ED Provider Notes (Signed)
 Maureen Anderson    CSN: [id number] Arrival date & time: 11/24/23  9180      History   Chief Complaint Chief Complaint  Patient presents with   Urinary Frequency    symptoms for 3 weeks, been taking the otc cranberry medicine and azt pain relief pills. thought it was getting better but i don't think so - Entered by patient    HPI Maureen Anderson is a 63 y.o. female.   Patient presents for evaluation of urinary frequency and dysuria present for 3 weeks.  Has attempted use of cranberry pills and Azo which has provided minimal relief.  Denies hematuria, abdominal or flank pain, fever or vaginal symptoms.  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   ABDOMINAL ABSCESS 04/20/2010    Past Surgical History:  Procedure Laterality Date   CESAREAN SECTION     x2   COLONOSCOPY  09/2015   normal    MELANOMA EXCISION     ORIF HUMERUS FRACTURE Right 03/08/2017   Procedure: OPEN REDUCTION INTERNAL FIXATION (ORIF) PROXIMAL HUMERUS FRACTURE;  Surgeon: Tobie Priest, MD;  Location: [facility] ORS;  Service: Orthopedics;  Laterality: Right;    OB History     Gravida  2   Para  2   Term  2   Preterm      AB      Living  3      SAB      IAB      Ectopic      Multiple  1   Live Births  3            Home Medications    Prior to Admission medications   Medication Sig Start Date End Date Taking? Authorizing Provider  nitrofurantoin , macrocrystal-monohydrate, (MACROBID ) 100 MG capsule Take 1 capsule (100 mg total) by mouth 2 (two) times daily. 11/24/23  Yes Hunter Bachar R, NP  Estradiol  (IMVEXXY  MAINTENANCE PACK) 10 MCG INST Place 10 mcg vaginally 2 (two) times a  week. Insert 1 supp vaginally twice wkly 10/28/23   Copland, Alicia B, PA-C  Multiple Vitamin (MULTIVITAMIN WITH MINERALS) TABS tablet Take 1 tablet by mouth 3 (three) times a week.    [provider]    Family History Family History  Problem Relation Age of Onset   Pancreatic cancer Maternal Grandmother 46   Colon cancer Maternal Grandfather 25   Breast cancer Other 53       daughter with pancreatic cancer    Social History Social History   Tobacco Use   Smoking status: Never   Smokeless tobacco: Never  Vaping Use   Vaping status: Never Used  Substance Use Topics   Alcohol use: Yes    Comment: occ   Drug use: No     Allergies   Patient has no known allergies.   Review of Systems Review of Systems   Physical Exam Triage Vital Signs ED Triage Vitals  Encounter Vitals Group     BP 11/24/23 0837 105/71     Girls Systolic BP Percentile --      Girls Diastolic BP Percentile --      Boys Systolic BP Percentile --  Boys Diastolic BP Percentile --      Pulse Rate 11/24/23 0837 77     Resp 11/24/23 0837 18     Temp 11/24/23 0837 98.2 F (36.8 C)     Temp Source 11/24/23 0837 Oral     SpO2 11/24/23 0837 98 %     Weight --      Height --      Head Circumference --      Peak Flow --      Pain Score 11/24/23 0835 0     Pain Loc --      Pain Education --      Exclude from Growth Chart --    No data found.  Updated Vital Signs BP 105/71 (BP Location: Left Arm)   Pulse 77   Temp 98.2 F (36.8 C) (Oral)   Resp 18   SpO2 98%   Visual Acuity Right Eye Distance:   Left Eye Distance:   Bilateral Distance:    Right Eye Near:   Left Eye Near:    Bilateral Near:     Physical Exam Constitutional:      Appearance: Normal appearance.  Eyes:     Extraocular Movements: Extraocular movements intact.  Pulmonary:     Effort: Pulmonary effort is normal.  Abdominal:     General: Abdomen is flat. Bowel sounds are normal.     Palpations: Abdomen is  soft.     Tenderness: There is no abdominal tenderness. There is no right CVA tenderness or left CVA tenderness.  Neurological:     Mental Status: She is alert and oriented to person, place, and time.      UC Treatments / Results  Labs (all labs ordered are listed, but only abnormal results are displayed) Labs Reviewed  POCT URINE DIPSTICK - Abnormal; Notable for the following components:      Result Value   Nitrite, UA Positive (*)    Leukocytes, UA Trace (*)    All other components within normal limits  URINE CULTURE    EKG   Radiology No results found.  Procedures Procedures (including critical care time)  Medications Ordered in UC Medications - No data to display  Initial Impression / Assessment and Plan / UC Course  I have reviewed the triage vital signs and the nursing notes.  Pertinent labs & imaging results that were available during my care of the patient were reviewed by me and considered in my medical decision making (see chart for details).  Urinary frequency, acute cystitis without hematuria  Urinalysis showing nitrates and leukocytes, sent for culture, initiating Macrobid  based on last culture available which shows E. coli, recommend over-the-counter medications and nonpharmacological supportive care with follow-up as needed Final Clinical Impressions(s) / UC Diagnoses   Final diagnoses:  Urinary frequency     Discharge Instructions      Your urinalysis shows Maureen Anderson blood cells and nitrates which are indicative of infection, your urine will be sent to the lab to determine exactly which bacteria is present, if any changes need to be made to your medications you will be notified  Begin use of macrobid  twice a day for 5 days   You may use over-the-counter Azo to help minimize your symptoms until antibiotic removes bacteria, this medication will turn your urine orange  Increase your fluid intake through use of water  As always practice good hygiene,  wiping front to back and avoidance of scented vaginal products to prevent further irritation  If symptoms  continue to persist after use of medication or recur please follow-up with urgent care or your primary doctor as needed    ED Prescriptions     Medication Sig Dispense Auth. Provider   nitrofurantoin , macrocrystal-monohydrate, (MACROBID ) 100 MG capsule Take 1 capsule (100 mg total) by mouth 2 (two) times daily. 10 capsule Ahtziry Saathoff R, NP      PDMP not reviewed this encounter.   Teresa Shelba [place], [place] 11/24/23 [phone]

## 2023-11-24 NOTE — Discharge Instructions (Addendum)
 Your urinalysis shows Maureen Anderson blood cells and nitrates which are indicative of infection, your urine will be sent to the lab to determine exactly which bacteria is present, if any changes need to be made to your medications you will be notified  Begin use of macrobid twice a day for 5 days   You may use over-the-counter Azo to help minimize your symptoms until antibiotic removes bacteria, this medication will turn your urine orange  Increase your fluid intake through use of water  As always practice good hygiene, wiping front to back and avoidance of scented vaginal products to prevent further irritation  If symptoms continue to persist after use of medication or recur please follow-up with urgent care or your primary doctor as needed

## 2023-11-24 NOTE — ED Triage Notes (Signed)
 Patient reports urinary frequency x 3 weeks. Patient has been taking the OTC cranberry medicine and AZO pain relief with mild relief.

## 2023-11-25 LAB — URINE CULTURE: Culture: <10000 — AB

## 2023-11-26 ENCOUNTER — Ambulatory Visit (HOSPITAL_COMMUNITY): Payer: Self-pay

## 2024-02-15 ENCOUNTER — Other Ambulatory Visit: Payer: Self-pay | Admitting: Student

## 2024-02-15 DIAGNOSIS — H9312 Tinnitus, left ear: Secondary | ICD-10-CM

## 2024-02-28 ENCOUNTER — Other Ambulatory Visit
